# Patient Record
Sex: Female | Born: 1965 | Race: White | Hispanic: No | Marital: Married | Smoking: Never smoker
Health system: Southern US, Community
[De-identification: ages and names within clinical notes are randomized; demographics above are authoritative.]

## PROBLEM LIST (undated history)

## (undated) DIAGNOSIS — Q453 Other congenital malformations of pancreas and pancreatic duct: Secondary | ICD-10-CM

## (undated) DIAGNOSIS — K224 Dyskinesia of esophagus: Secondary | ICD-10-CM

## (undated) DIAGNOSIS — K219 Gastro-esophageal reflux disease without esophagitis: Secondary | ICD-10-CM

## (undated) HISTORY — PX: ROTATOR CUFF REPAIR: SHX139

## (undated) HISTORY — PX: OOPHORECTOMY: SHX86

## (undated) HISTORY — PX: APPENDECTOMY: SHX54

## (undated) HISTORY — DX: Other congenital malformations of pancreas and pancreatic duct: Q45.3

---

## 1998-10-30 ENCOUNTER — Other Ambulatory Visit: Admission: RE | Admit: 1998-10-30 | Discharge: 1998-10-30 | Payer: Self-pay | Admitting: *Deleted

## 1999-10-30 ENCOUNTER — Other Ambulatory Visit: Admission: RE | Admit: 1999-10-30 | Discharge: 1999-10-30 | Payer: Self-pay | Admitting: *Deleted

## 2000-11-15 ENCOUNTER — Other Ambulatory Visit: Admission: RE | Admit: 2000-11-15 | Discharge: 2000-11-15 | Payer: Self-pay | Admitting: *Deleted

## 2001-11-30 ENCOUNTER — Other Ambulatory Visit: Admission: RE | Admit: 2001-11-30 | Discharge: 2001-11-30 | Payer: Self-pay | Admitting: *Deleted

## 2002-12-12 ENCOUNTER — Other Ambulatory Visit: Admission: RE | Admit: 2002-12-12 | Discharge: 2002-12-12 | Payer: Self-pay | Admitting: Obstetrics and Gynecology

## 2003-01-02 ENCOUNTER — Ambulatory Visit (HOSPITAL_COMMUNITY): Admission: RE | Admit: 2003-01-02 | Discharge: 2003-01-02 | Payer: Self-pay | Admitting: Obstetrics & Gynecology

## 2003-01-02 ENCOUNTER — Encounter: Payer: Self-pay | Admitting: Obstetrics & Gynecology

## 2004-01-17 ENCOUNTER — Other Ambulatory Visit: Admission: RE | Admit: 2004-01-17 | Discharge: 2004-01-17 | Payer: Self-pay | Admitting: Obstetrics and Gynecology

## 2005-06-11 ENCOUNTER — Ambulatory Visit (HOSPITAL_COMMUNITY): Admission: RE | Admit: 2005-06-11 | Discharge: 2005-06-11 | Payer: Self-pay | Admitting: Obstetrics and Gynecology

## 2005-06-11 ENCOUNTER — Encounter (INDEPENDENT_AMBULATORY_CARE_PROVIDER_SITE_OTHER): Payer: Self-pay | Admitting: Specialist

## 2005-07-10 ENCOUNTER — Encounter: Admission: RE | Admit: 2005-07-10 | Discharge: 2005-07-10 | Payer: Self-pay | Admitting: Family Medicine

## 2006-01-05 ENCOUNTER — Ambulatory Visit (HOSPITAL_COMMUNITY): Admission: RE | Admit: 2006-01-05 | Discharge: 2006-01-05 | Payer: Self-pay | Admitting: Orthopedic Surgery

## 2006-04-01 ENCOUNTER — Ambulatory Visit (HOSPITAL_BASED_OUTPATIENT_CLINIC_OR_DEPARTMENT_OTHER): Admission: RE | Admit: 2006-04-01 | Discharge: 2006-04-01 | Payer: Self-pay | Admitting: Orthopedic Surgery

## 2006-10-16 ENCOUNTER — Emergency Department (HOSPITAL_COMMUNITY): Admission: EM | Admit: 2006-10-16 | Discharge: 2006-10-16 | Payer: Self-pay | Admitting: Emergency Medicine

## 2006-12-13 ENCOUNTER — Ambulatory Visit: Payer: Self-pay | Admitting: Internal Medicine

## 2006-12-22 ENCOUNTER — Ambulatory Visit: Payer: Self-pay

## 2006-12-22 ENCOUNTER — Encounter: Payer: Self-pay | Admitting: Cardiology

## 2006-12-22 ENCOUNTER — Encounter: Admission: RE | Admit: 2006-12-22 | Discharge: 2006-12-22 | Payer: Self-pay | Admitting: Obstetrics and Gynecology

## 2007-02-14 ENCOUNTER — Ambulatory Visit: Payer: Self-pay | Admitting: Internal Medicine

## 2007-03-17 ENCOUNTER — Encounter: Admission: RE | Admit: 2007-03-17 | Discharge: 2007-03-17 | Payer: Self-pay | Admitting: Gastroenterology

## 2007-09-05 ENCOUNTER — Encounter: Admission: RE | Admit: 2007-09-05 | Discharge: 2007-09-05 | Payer: Self-pay | Admitting: Family Medicine

## 2007-09-09 ENCOUNTER — Ambulatory Visit: Payer: Self-pay | Admitting: Internal Medicine

## 2007-09-20 ENCOUNTER — Encounter: Admission: RE | Admit: 2007-09-20 | Discharge: 2007-09-20 | Payer: Self-pay | Admitting: Family Medicine

## 2007-09-29 ENCOUNTER — Encounter: Admission: RE | Admit: 2007-09-29 | Discharge: 2007-09-29 | Payer: Self-pay | Admitting: Gastroenterology

## 2007-10-14 ENCOUNTER — Encounter: Admission: RE | Admit: 2007-10-14 | Discharge: 2007-10-14 | Payer: Self-pay | Admitting: Gastroenterology

## 2007-12-27 ENCOUNTER — Encounter: Admission: RE | Admit: 2007-12-27 | Discharge: 2007-12-27 | Payer: Self-pay | Admitting: Internal Medicine

## 2008-03-12 ENCOUNTER — Ambulatory Visit: Payer: Self-pay | Admitting: Pulmonary Disease

## 2008-03-12 DIAGNOSIS — R0602 Shortness of breath: Secondary | ICD-10-CM | POA: Insufficient documentation

## 2008-03-12 DIAGNOSIS — Q453 Other congenital malformations of pancreas and pancreatic duct: Secondary | ICD-10-CM | POA: Insufficient documentation

## 2008-03-13 ENCOUNTER — Ambulatory Visit: Payer: Self-pay

## 2008-03-13 ENCOUNTER — Encounter: Payer: Self-pay | Admitting: Pulmonary Disease

## 2008-03-22 ENCOUNTER — Ambulatory Visit: Payer: Self-pay | Admitting: Pulmonary Disease

## 2008-03-23 ENCOUNTER — Telehealth (INDEPENDENT_AMBULATORY_CARE_PROVIDER_SITE_OTHER): Payer: Self-pay | Admitting: *Deleted

## 2008-03-30 ENCOUNTER — Ambulatory Visit: Payer: Self-pay | Admitting: Pulmonary Disease

## 2009-05-30 ENCOUNTER — Encounter: Payer: Self-pay | Admitting: Internal Medicine

## 2009-06-06 ENCOUNTER — Ambulatory Visit: Payer: Self-pay | Admitting: Internal Medicine

## 2009-06-06 DIAGNOSIS — R079 Chest pain, unspecified: Secondary | ICD-10-CM | POA: Insufficient documentation

## 2009-06-06 DIAGNOSIS — R0789 Other chest pain: Secondary | ICD-10-CM | POA: Insufficient documentation

## 2009-06-20 ENCOUNTER — Encounter: Payer: Self-pay | Admitting: Internal Medicine

## 2009-06-20 ENCOUNTER — Ambulatory Visit: Payer: Self-pay

## 2010-02-07 ENCOUNTER — Encounter: Admission: RE | Admit: 2010-02-07 | Discharge: 2010-02-07 | Payer: Self-pay | Admitting: Otolaryngology

## 2010-04-24 ENCOUNTER — Ambulatory Visit: Payer: Self-pay | Admitting: Oncology

## 2010-06-05 ENCOUNTER — Ambulatory Visit: Payer: Self-pay | Admitting: Oncology

## 2010-07-07 ENCOUNTER — Ambulatory Visit: Payer: Self-pay | Admitting: Oncology

## 2010-10-22 LAB — HM DEXA SCAN: HM Dexa Scan: NORMAL

## 2010-10-22 LAB — HM MAMMOGRAPHY

## 2010-11-02 ENCOUNTER — Encounter: Payer: Self-pay | Admitting: Internal Medicine

## 2010-11-02 ENCOUNTER — Encounter: Payer: Self-pay | Admitting: Family Medicine

## 2010-11-13 NOTE — Assessment & Plan Note (Signed)
Summary: consult for dyspnea   Referred by:  [name] PCP:  [name]  Chief Complaint:  Pulmonary Consult.  History of Present Illness: the patient is a 45 year old female who I have been asked to see for dyspnea.  The patient states that she was in her usual state of health until approximately 6 months ago, when she began to have difficulty taking a deep breath.  The patient feels her breathing has been getting worse since that time.  She describes an atypical pain/discomfort that is in her epigastrium and lower left chest.  This has been an ongoing issue since December, and an extensive workup has been unrevealing.  The patient had a recent CT chest in March which showed no evidence for pulmonary embolus, no parenchymal or airway issues, and a right cystlike structure on her pulmonary vein that had decreased in size from the last scan.  The patient states that she does not get winded, but feels that she is unable to take a deep breath.  She can walk on a treadmill for at least 20 minutes without shortness of breath, and can do her light housework without difficulties.  Her discomfort and dyspnea is worse at night, and it does not matter about body position.  She has had no significant cough or mucus production.  She has had no recent echo, and has never had pulmonary function studies.  She does have a history of reflux disease for which she is on Nexium.  Of note, the patient's weight is up 10 pounds over the last two years.     Current Allergies: ! 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     pt is  married.    pt does not have any children.   Risk Factors:  Tobacco use:  never   Review of Systems      See HPI   Vital Signs:  Patient Profile:   45 Years Old Female Height:     62 inches Weight:      133.25 pounds O2 Sat:      100 % O2 treatment:    Room Air Temp:     98.8 degrees F oral Pulse rate:   80 / minute BP sitting:   110 / 70  (left arm) Cuff size:   regular  Vitals Entered By: Cyndia Diver LPN (March 12, 453 2:12 PM)             Is Patient Diabetic? No Comments Medications reviewed with patient Cyndia Diver LPN  March 12, 980 2:12 PM      Physical Exam  General:     [name] female in no acute distress Eyes:     PERRLA and EOMI.   Nose:     patent without discharge Mouth:     clear Neck:     mid JVD, thyromegaly, or lymphadenopathy. Lungs:     totally clear to auscultation Heart:     regular rate and rhythm, no MRG Abdomen:     soft and  nontender, bowel sounds present Extremities:     no significant edema, pulses intact distally Neurologic:     alert and oriented, moves all 4 extremities.     Impression & Recommendations:  Problem # 1:  DYSPNEA (ICD-786.05) the patient is describing shortness of breath is related to an inability to take a deep breath.  She believes this is in turn related to her epigastric/lower chest discomfort.the patient can exercise and do moderate activity without any issues, but then can have symptoms of shortness of breath at rest whether lying or sitting.  At this point, I would like to do full pulmonary function studies to look at lung volumes and also airflows, as well as an echocardiogram to rule out pulmonary hypertension and possibly mitral valve prolapse.  The patient is agreeable to this approach.  Medications Added to Medication List This Visit: 1)  Nexium 40 Mg Cpdr (Esomeprazole magnesium) .... Take 1 tablet by mouth once a day   Patient Instructions: 1)  will schedule for breathing tests and  ultrasound of heart. 2)  will call once test results available.   ]

## 2010-11-13 NOTE — Letter (Signed)
Summary: [facility]   Imported By: Kassie Mends 07/23/2009 09:41:43  _____________________________________________________________________  External Attachment:    Type:   Image     Comment:   External Document

## 2010-11-13 NOTE — Miscellaneous (Signed)
Summary: Orders Update pft charges  Clinical Lists Changes  Orders: Added new Service order of Carbon Monoxide diffusing w/capacity (94720) - Signed Added new Service order of Lung Volumes (94240) - Signed Added new Service order of Spirometry (Pre & Post) (94060) - Signed 

## 2010-11-13 NOTE — Assessment & Plan Note (Signed)
Summary: [name]   Referring Provider:  Sherin Quarry Primary Provider:  Lupe Carney  CC:  [phone], pt reports chest pains, and intermittently and they may come on any time of day. She has been feeling very fatigued and tires easily.  [name]  History of Present Illness: [name] is a 45 year old woman, who I have seen in the past. Her chest pain. She comes in today for a return evaluation.  I last saw [name] back in November of 2008. Did not plan further testing. She had had an echocardiogram that was normal. After this, the patient was seen by Mellody Dance clamps. PFTs were done, which were normal.  The patient is now being evaluated at [facility] in the GI department by Dr. Lanell Matar. She she tells me she has a stomach emptying study and an esophagus test, which were both normal. She is scheduled for endoscopy on September 17.  She continues to have episodes of chest pain. She describes them at their worst as a stabbing sensation in the left parasternal area. They radiate to her arm. She does have some back pain.  Dr. Lanell Matar has given her [place] prescription for Baclofen to try.  The spells occur any time. Note no particular association though more often with walking. She has cut back on her activities for concern of these symptoms.she does not have anything that will alleviate other than time.  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 Take 1 Tablet By Mouth Once A Day  Current Medications (verified): 1)  Nexium 40 Mg  Cpdr (Esomeprazole Magnesium) .... Take 1 Tablet By Mouth Once A Day 2)  Baclofen 10 Mg Tabs (Baclofen) .... Two Times A Day 3)  Aspirin 81 Mg Tabs (Aspirin) .... At Bedtime  Allergies (verified): 1)  ! Sulfa  Past History:  Past Surgical  History: Last updated: 03/12/2008 status post appendectomy and right oophorectomy status post rotator cuff repair  Family History: Last updated: 03/12/2008 emphysema: paternal grandfather allergies: mother asthma: mother heart disease: maternal grandmother cancer: maternal grandfather (lymphoma), maternal aunt (breast)   Social History: Last updated: 03/12/2008 Patient never smoked.  pt is married. pt does not have any children.  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 Negative to the above problem except as noted  Vital Signs:  Patient profile:   45 year old female Height:      62 inches Weight:      129 pounds BMI:     23.68 Pulse rate:   79 / minute Pulse rhythm:   regular BP sitting:   124 / 72  (left arm) Cuff size:   regular  Vitals Entered By: Judithe Modest CMA (June 06, 2009 9:23 AM)  Physical Exam  Additional Exam:  HEENT:  Normocephalic, atraumatic. EOMI, PERRLA.  Neck: JVP is normal. No thyromegaly. No bruits.  Lungs: clear to auscultation. No rales no wheezes.  Heart: Regular rate and rhythm. Normal S1, S2. No S3.   No significant murmurs. PMI not displaced.  Abdomen:  Supple, nontender. Normal bowel sounds. No masses. No hepatomegaly.  Extremities:   Good distal pulses throughout. No lower extremity edema.  Musculoskeletal :moving all extremities.  Neuro:   alert  and oriented x3.    EKG  Procedure date:  06/06/2009  Findings:      NSR. 79 bpm  Impression & Recommendations:  Problem # 1:  CHEST PAIN-UNSPECIFIED (ICD-786.50) patient has had long history of chest pain. Note there has not been a progression. There are some atypical features. Note CT and scan in the past did not show any evidence of calcification of the coronary arteries. Indeed, she may have coronary spasm.  What I would recommend is scheduling a stress echo if this is negative, empiric  trial of some calcium  channel blocker for spasm.  Note in review of her history. Her lipids are excellent. Her updated medication list for this problem includes:    Aspirin 81 Mg Tabs (Aspirin) .[name]... At bedtime  Other Orders: EKG w/ Interpretation (93000) EKG w/ Interpretation (93000) Stress Echo (Stress Echo)  Patient Instructions: 1)  Your physician has requested that you have a stress echocardiogram. For further information please visit [email or web address]  Please follow instruction sheet as given. Will call you  with results of test.

## 2010-12-26 ENCOUNTER — Encounter: Payer: Self-pay | Admitting: Family Medicine

## 2011-02-14 ENCOUNTER — Encounter: Payer: Self-pay | Admitting: Family Medicine

## 2011-02-20 ENCOUNTER — Encounter: Payer: Self-pay | Admitting: Family Medicine

## 2011-02-20 ENCOUNTER — Ambulatory Visit (INDEPENDENT_AMBULATORY_CARE_PROVIDER_SITE_OTHER): Payer: Managed Care, Other (non HMO) | Admitting: Family Medicine

## 2011-02-20 DIAGNOSIS — M6281 Muscle weakness (generalized): Secondary | ICD-10-CM

## 2011-02-20 DIAGNOSIS — K219 Gastro-esophageal reflux disease without esophagitis: Secondary | ICD-10-CM

## 2011-02-20 DIAGNOSIS — Z13220 Encounter for screening for lipoid disorders: Secondary | ICD-10-CM

## 2011-02-20 DIAGNOSIS — J309 Allergic rhinitis, unspecified: Secondary | ICD-10-CM | POA: Insufficient documentation

## 2011-02-20 DIAGNOSIS — R5383 Other fatigue: Secondary | ICD-10-CM | POA: Insufficient documentation

## 2011-02-20 DIAGNOSIS — R5381 Other malaise: Secondary | ICD-10-CM

## 2011-02-20 DIAGNOSIS — G43909 Migraine, unspecified, not intractable, without status migrainosus: Secondary | ICD-10-CM

## 2011-02-20 DIAGNOSIS — R011 Cardiac murmur, unspecified: Secondary | ICD-10-CM

## 2011-02-20 DIAGNOSIS — IMO0002 Reserved for concepts with insufficient information to code with codable children: Secondary | ICD-10-CM

## 2011-02-20 DIAGNOSIS — R29898 Other symptoms and signs involving the musculoskeletal system: Secondary | ICD-10-CM | POA: Insufficient documentation

## 2011-02-20 LAB — COMPREHENSIVE METABOLIC PANEL
ALT: 17 U/L (ref 0–35)
AST: 18 U/L (ref 0–37)
Albumin: 4.5 g/dL (ref 3.5–5.2)
Alkaline Phosphatase: 56 U/L (ref 39–117)
BUN: 15 mg/dL (ref 6–23)
CO2: 31 mEq/L (ref 19–32)
Calcium: 9.6 mg/dL (ref 8.4–10.5)
Chloride: 103 mEq/L (ref 96–112)
Creatinine, Ser: 0.6 mg/dL (ref 0.4–1.2)
GFR: 124.02 mL/min (ref >60.00)
Glucose, Bld: 90 mg/dL (ref 70–99)
Potassium: 4.5 mEq/L (ref 3.5–5.1)
Sodium: 142 mEq/L (ref 135–145)
Total Bilirubin: 0.6 mg/dL (ref 0.3–1.2)
Total Protein: 7.1 g/dL (ref 6.0–8.3)

## 2011-02-20 LAB — CBC WITH DIFFERENTIAL/PLATELET
Basophils Absolute: 0 10*3/uL (ref 0.0–0.1)
Basophils Relative: 0.6 % (ref 0.0–3.0)
Eosinophils Absolute: 0 10*3/uL (ref 0.0–0.7)
Eosinophils Relative: 0.9 % (ref 0.0–5.0)
HCT: 34.6 % — ABNORMAL LOW (ref 36.0–46.0)
Hemoglobin: 11.8 g/dL — ABNORMAL LOW (ref 12.0–15.0)
Lymphocytes Relative: 38.9 % (ref 12.0–46.0)
Lymphs Abs: 1.8 10*3/uL (ref 0.7–4.0)
MCHC: 34 g/dL (ref 30.0–36.0)
MCV: 85.7 fl (ref 78.0–100.0)
Monocytes Absolute: 0.4 10*3/uL (ref 0.1–1.0)
Monocytes Relative: 9 % (ref 3.0–12.0)
Neutro Abs: 2.4 10*3/uL (ref 1.4–7.7)
Neutrophils Relative %: 50.6 % (ref 43.0–77.0)
Platelets: 315 10*3/uL (ref 150.0–400.0)
RBC: 4.04 Mil/uL (ref 3.87–5.11)
RDW: 13.1 % (ref 11.5–14.6)
WBC: 4.7 10*3/uL (ref 4.5–10.5)

## 2011-02-20 LAB — LIPID PANEL
Cholesterol: 206 mg/dL — ABNORMAL HIGH (ref 0–200)
HDL: 83.5 mg/dL (ref >39.00)
Total CHOL/HDL Ratio: 2
Triglycerides: 33 mg/dL (ref 0.0–149.0)
VLDL: 6.6 mg/dL (ref 0.0–40.0)

## 2011-02-20 LAB — VITAMIN B12: Vitamin B-12: 206 pg/mL — ABNORMAL LOW (ref 211–911)

## 2011-02-20 LAB — TSH: TSH: 0.36 u[IU]/mL (ref 0.35–5.50)

## 2011-02-20 LAB — LDL CHOLESTEROL, DIRECT: Direct LDL: 109.4 mg/dL

## 2011-02-20 NOTE — Patient Instructions (Signed)
We will call with lab results   

## 2011-02-20 NOTE — Assessment & Plan Note (Signed)
Seeing Dr. Darrelyn Hillock. X-rays and MRI in 10/2010 Currently improved on indomethacin. Has follow up in next week.

## 2011-02-20 NOTE — Assessment & Plan Note (Signed)
Well controlled on current med. She was counsled to be careful with indomethacin. Stop if stomach irritation.

## 2011-02-20 NOTE — Progress Notes (Signed)
Subjective:    Patient ID: [name], female    DOB: Feb 23, 1966, 45 y.o.   MRN: [id number]  HPI  45 year old female here to establish. Has previously seen Dr. Clovis Riley at [facility].  Sees GYN for yearly CPX: Dr. Algie Coffer at [facility].  Laast CPX 03/2010   Has history of esophageal spasms,  GERD and pancreatic divisum: Controlled with baclofen and nexium. Last GI MD was Dr. Merleen Milliner at [facility]. Has also seen MD at [facility] for this issue, but remotely. Last EGD  approximately 2007.  When she was having chest pain due to esophageal spasm.. She did she cardiology. Dr. Tenny Craw with [facility]. Had low risk treadmill stress test and nml EKG,  ECHO of heart..nml per pt.   Review of Systems  Constitutional: Positive for fatigue. Negative for fever and unexpected weight change.       Hot flashes Mild fatigue, not sure if due to recent back issues and trouble sleeping.   HENT: Negative for ear pain, congestion, sore throat, sneezing, trouble swallowing and sinus pressure.   Eyes: Negative for pain and itching.  Respiratory: Negative for cough, shortness of breath and wheezing.   Cardiovascular: Negative for chest pain, palpitations and leg swelling.  Gastrointestinal: Positive for diarrhea. Negative for nausea, abdominal pain, constipation and blood in stool.       Since on indocin  Genitourinary: Negative for dysuria, hematuria, vaginal discharge, difficulty urinating and menstrual problem.  Musculoskeletal: Positive for arthralgias. Negative for myalgias.       Occ mild ache in B ankles/wrists  Skin: Negative for rash.  Neurological: Negative for syncope, weakness, light-headedness, numbness and headaches.       Dr. Darrelyn Hillock noted some right foot flexion weakness but this is not in area that is associated with her herniated disc.  Psychiatric/Behavioral: Negative for confusion and dysphoric mood. The patient is not nervous/anxious.        Objective:   Physical Exam    Constitutional: Vital signs are normal. She appears well-developed and well-nourished. She is cooperative.  Non-toxic appearance. She does not appear ill. No distress.  HENT:  Head: Normocephalic.  Right Ear: Hearing, tympanic membrane, external ear and ear canal normal.  Left Ear: Hearing, tympanic membrane, external ear and ear canal normal.  Nose: Nose normal.  Eyes: Conjunctivae, EOM and lids are normal. Pupils are equal, round, and reactive to light. No foreign bodies found.  Neck: Trachea normal and normal range of motion. Neck supple. Carotid bruit is not present. No mass and no thyromegaly present.  Cardiovascular: Normal rate, regular rhythm, S1 normal, S2 normal and intact distal pulses.  Exam reveals no gallop.   Murmur heard.  Systolic murmur is present with a grade of 2/6  Pulmonary/Chest: Effort normal and breath sounds normal. No respiratory distress. She has no wheezes. She has no rhonchi. She has no rales.  Abdominal: Soft. Normal appearance and bowel sounds are normal. She exhibits no distension, no fluid wave, no abdominal bruit and no mass. There is no hepatosplenomegaly. There is no tenderness. There is no rebound, no guarding and no CVA tenderness. No hernia.  Genitourinary: Uterus normal.  Lymphadenopathy:    She has no cervical adenopathy.    She has no axillary adenopathy.  Neurological: She is alert. She has normal reflexes. She displays no atrophy and no tremor. No cranial nerve deficit or sensory deficit. She exhibits normal muscle tone. She displays no seizure activity.       Strength normal except weakness  4/5 in left foot flexion  Skin: Skin is warm, dry and intact. No rash noted.  Psychiatric: Her speech is normal and behavior is normal. Judgment normal. Her mood appears not anxious. Cognition and memory are normal. She does not exhibit a depressed mood.          Assessment & Plan:

## 2011-02-20 NOTE — Assessment & Plan Note (Signed)
Will eval with labs. MRI spine showed no cause. If continues consider further eval.

## 2011-02-20 NOTE — Assessment & Plan Note (Signed)
Eval with labs. May be due  To back pain causing sleep issues at times.

## 2011-02-21 LAB — VITAMIN D 25 HYDROXY (VIT D DEFICIENCY, FRACTURES): Vit D, 25-Hydroxy: 46 ng/mL (ref 30–89)

## 2011-02-24 NOTE — Assessment & Plan Note (Signed)
[facility]                            CARDIOLOGY OFFICE NOTE   [name], [name]                  MRN:          [id number]  DATE:09/09/2007                            DOB:          04/28/1966    IDENTIFICATION:  The patient is a 45 year old who I have followed in the  past.  I last saw her back in May of this year.  She actually was seen  for chest pain, which at the time I thought was GI, and also  palpitations, which, when I saw her last, she was not having any.   The patient comes back in today for reevaluation.  She has been followed  by Corky Mull at [facility], and also has been seen by Dr. Sherin Quarry in GI,  has had an EGD and abdominal ultrasound which have reportedly been  negative.   In talking to the patient today, she continues to have episodes of chest  pain.  They are atypical in that she can have them while sitting.  They  are not associated with any particular activity.  In fact, she can walk  the dog and not have any chest pressure.  The pressure is left sided,  and she notes some numbness in her fingers.   She has had over the past week and a half or so, feeling like she cannot  get a deep breath.   Here in the office she says also she has been having some more belching  than usual.  She says occasionally she will have a choking sensation on  her food, though she has not choked.   She still is able to walk the dog and do housework.  Notes no change, no  chest pressure with this.   CURRENT MEDICATIONS:  1. Calcium.  2. Protonix 40 daily.  3. She had been on Pepcid b.i.d. as well.  She felt worse.   PHYSICAL EXAMINATION:  The patient currently noting that she has some  chest pressure, left sided, sitting here.  Blood pressure is 113/68, pulse is 82 and regular, weight is 127.  Her lungs are clear.  CARDIAC EXAM:  Regular rate and rhythm, S1, S2 no S3.  Grade 1 to 2/6  systolic murmur at the apex that decreases to near  zero with standing.  ABDOMEN:  Mild epigastric tenderness.  EXTREMITIES:  No edema.   A 12-lead EKG (with pain), normal sinus rhythm, 82 beats per minute.   IMPRESSION:  [name] is a 45 year old with no real risk factors other  than a grandmother having heart issues in her 41s.  She is not a smoker,  she is active, cholesterol is good.  She continues to have chest pain.  Her pain is atypical in the way it presents.  She does have some GI  symptoms, and also some tenderness in her epigastric region.  She is  noting some more belching.  Food may be moving a little slower.   I am not convinced the pain is cardiac, and I am a little reluctant to  proceed with further testing in this area.  I would like  to discuss  things with Corky Mull, her primary, and decide where to proceed.  At  the most, a stress echocardiogram, but, again, risks with this would be  false positives, I think, in this patient.   I encouraged her to do activities as tolerated, and I will be in touch  with her once I have spoken with Corky Mull.     Pricilla Riffle, MD, [facility]  Electronically Signed    PVR/MedQ  DD: 09/09/2007  DT: 09/09/2007  Job #: [id number]   cc:   Corky Mull

## 2011-02-27 NOTE — H&P (Signed)
NAME:  [name], [name]                ACCOUNT NO.:  [id number]   MEDICAL RECORD NO.:  [id number]          PATIENT TYPE:  AMB   LOCATION:  [facility]                           FACILITY:  [facility]   PHYSICIAN:  Richardean Sale, M.D.   DATE OF BIRTH:  Dec 15, 1965   DATE OF ADMISSION:  06/11/2005  DATE OF DISCHARGE:                                HISTORY & PHYSICAL   PREOPERATIVE DIAGNOSIS:  This is a 45 year old gravida 0 white female who  was found to have a simple-appearing ovarian cyst on ultrasound in June of  2006 that was asymptomatic.  Measurements at that time were 5.6 cm in  maximum diameter.  The patient returned for a follow up ultrasound 8 weeks  later, and the cyst was found to have enlarged to 6.4 cm in maximum  diameter, and a newer, smaller cyst measuring 3 cm in maximum diameter was  found adjacent to it.  Given the enlargement of the ovarian cyst, the  patient presents today for laparoscopy with ovarian cystectomy and possible  salpingo-oophorectomy.   PAST MEDICAL HISTORY:  History of elevated prolactin level with normal MRI  and resolution of prolactin level.  Last prolactin drawn on March 24, 2005  was within normal limits.   PAST SURGICAL HISTORY:  1.  Appendectomy.  2.  Left breast biopsy for benign disease.   OBSTETRIC HISTORY:  Gravida 0.   GYNECOLOGIC HISTORY:  No history of abnormal Pap smears or sexually  transmitted infections.  Currently taking Micronor for contraception.   SOCIAL HISTORY:  She is married.  Denies tobacco, alcohol, or drugs.   FAMILY HISTORY:  Maternal aunt with postmenopausal breast cancer.  A sister  with thyroid disease.   MEDICATIONS:  1.  Micronor.  2.  Multivitamin.   ALLERGIES:  SULFA.   PHYSICAL EXAMINATION:  VITAL SIGNS:  Blood pressure is 102/64, temperature  98.7, weight 129 pounds.  GENERAL:  She is a well-developed, well-nourished white female who is in no  acute distress.  HEART:  Regular rate and rhythm.  LUNGS:  Clear to  auscultation bilaterally.  NECK:  Neck and thyroid are within normal limits without masses.  ABDOMEN:  Soft, nontender, nondistended, with no palpable masses.  Liver and  spleen are normal.  There is no hernia.  EXTREMITIES:  Within normal limits, and show no clubbing, cyanosis, or  edema.  NEUROLOGIC:  Within normal limits.  PELVIC:  External genitalia, vagina, and cervix all appear normal.  There is  no cervical motion tenderness.  The uterus is small, anteverted, in the  midline.  There is fullness in the right adnexa.  No tenderness elicited.  Left adnexa normal.   Ultrasound - uterus measures 7 x 3.8 x 3.1 cm.  It is anteverted.  Endometrial thickness of 3.5 mm.  The left ovary is normal.  On the right  ovary, there are 2 simple-appearing cysts, the largest of which is 6.4 cm.  The second is 3 cm.   ASSESSMENT:  A 45 year old gravida 0 white female with an enlarging right  ovarian cyst.   PLAN:  1.  Will proceed with diagnostic laparoscopy, possible ovarian cystectomy,      and possible salpingo-oophorectomy.  Reviewed with patient that risk of      malignancy is low, given the appearance of this cyst on ultrasound, but      that further surgery may be needed in the future.  I reviewed with risks      of the procedure which include, but are not limited to, hemorrhage,      infection, injury to the bowel, the bladder, the ureters, or other      abdominal organs which could require additional surgery, either at the      time of this procedure or in the future, and the possibility of      converting to open laparotomy.  Reviewed the risk of deep vein      thrombosis and pulmonary embolus.  Also reviewed the risk of anesthesia.      The patient voices understanding of all these risks and desires to      proceed.  Informed consent has been obtained.           ______________________________  Richardean Sale, M.D.     JW/MEDQ  D:  06/10/2005  T:  06/10/2005  Job:  [id number]

## 2011-02-27 NOTE — Op Note (Signed)
NAME:  [name], [name]                ACCOUNT NO.:  [id number]   MEDICAL RECORD NO.:  [id number]          PATIENT TYPE:  AMB   LOCATION:  DSC                          FACILITY:  [facility]   PHYSICIAN:  Rodney A. Mortenson, M.D.DATE OF BIRTH:  [birth year]   DATE OF PROCEDURE:  04/01/2006  DATE OF DISCHARGE:                                 OPERATIVE REPORT   JUSTIFICATION:  This 45 year old female has been having trouble with her  left shoulder for a total of 4 months.  She has had two subacromial  injections and neither are giving her much relief.  She continues to have  pain and discomfort and MRI did show a bursal side tear of the supraspinatus  tendon with impingement about the shoulder.  She is now admitted for  surgical correction.  Questions answered and encouraged preoperatively.  Complications were discussed.   PREOPERATIVE DIAGNOSIS:  Bursa side tear rotator cuff left shoulder with  secondary impingement syndrome left shoulder.   POSTOPERATIVE DIAGNOSIS:  Bursa side tear rotator cuff left shoulder with  secondary impingement syndrome left shoulder.   PROCEDURE:  Diagnostic arthroscopy left shoulder; open acromioplasty;  excision of bursal side partial tear rotator cuff and repair left shoulder.   SURGEON:  Lenard Galloway. Chaney Malling, M.D.   ANESTHESIA:  General.   DESCRIPTION OF PROCEDURE:  The patient was placed on the operating table in  supine position.  After satisfactory general anesthesia, the patient was  placed in semisitting position.  The left upper extremity and shoulder was  prepped with DuraPrep and draped in the usual fashion.  Through a posterior  standard portal, arthroscope was introduced and operative portal was placed  anteriorly.  A very careful examination of the shoulder was obtained.  Articular cartilage over the humeral head and glenoid was absent and normal.  The entire circumference of the labrum was normal and the labral attachment  to the biceps was  normal.  The undersurface of the supraspinatus was  examined very carefully and there was only a small area of some fraying.  This was minimal.  The arthroscope was then placed in the subacromial space  and there was an area underneath the acromion where there was marked fraying  and tearing of the supraspinatus and this was the area that was seen on the  MRI.  The arthroscope was then removed.   A Sabercut incision made over the anterolateral aspect of the left shoulder.  Skin edges were retracted.  Bleeders were coagulated.  Deltoid fibers were  released off the anterior aspect of the acromion only.  Subacromial space  could then be entered.  The shoulder was fairly tight.  The shoulder went  through a full range of motion very easily and the humerus was distracted.  The subacromial space was then entered.  Using a small saw, a Neer anterior  acromioplasty was done was the standard manner.  This gave excellent access  to the shoulder strength.  The area of fraying of the supraspinatus could  clearly be seen.  This area was excised in an elliptical manner and a single  Tycron suture used to close this area.  This part of the tendon was  degenerative.  The shoulder was irrigated throughout.  No other pathology  was noted.  Excellent range of motion about the shoulder.  Deltoid fibers  were reattached with 0 Vicryl and 2-0 Vicryl was used to close the  subcutaneous tissue and stainless steel staples were used to close the  skin.  Sterile dressings were applied and the patient returned to the  recovery room in excellent condition.  Technically this went extremely well.   FOLLOWUP:  To my office on Wednesday.  Percocet for pain.           ______________________________  Lenard Galloway. Chaney Malling, M.D.     RAM/MEDQ  D:  04/01/2006  T:  04/01/2006  Job:  [id number]

## 2011-03-04 ENCOUNTER — Encounter: Payer: Self-pay | Admitting: *Deleted

## 2011-04-02 ENCOUNTER — Other Ambulatory Visit: Payer: Self-pay | Admitting: Obstetrics

## 2011-05-25 ENCOUNTER — Other Ambulatory Visit: Payer: Self-pay | Admitting: Family Medicine

## 2011-05-29 ENCOUNTER — Encounter: Payer: Self-pay | Admitting: Family Medicine

## 2011-05-29 ENCOUNTER — Ambulatory Visit (INDEPENDENT_AMBULATORY_CARE_PROVIDER_SITE_OTHER): Payer: Managed Care, Other (non HMO) | Admitting: Family Medicine

## 2011-05-29 DIAGNOSIS — M545 Low back pain, unspecified: Secondary | ICD-10-CM | POA: Insufficient documentation

## 2011-05-29 DIAGNOSIS — N949 Unspecified condition associated with female genital organs and menstrual cycle: Secondary | ICD-10-CM | POA: Insufficient documentation

## 2011-05-29 DIAGNOSIS — IMO0002 Reserved for concepts with insufficient information to code with codable children: Secondary | ICD-10-CM

## 2011-05-29 MED ORDER — GABAPENTIN 100 MG PO CAPS: 100.0000 mg | ORAL_CAPSULE | Freq: Three times a day (TID) | ORAL | Status: DC

## 2011-05-29 NOTE — Progress Notes (Signed)
Subjective:    Patient ID: [name], female    DOB: Mar 15, 1966, 45 y.o.   MRN: [id number]  HPI  45 year old female with history of past herniated disc.. followed in past by Dr. Darrelyn Hillock. Hx of MRI spine in 10/2010 showed ruptured disc. Was previously controlled on indomethacin.. Stopped due to Gi upset. Tylenol does not help. Did not recommend surgery at that time unless progressed to radiate down legs.  Presents today with continued low back pain. Worsened pain in last few weeks. Pain previously only on the left side...now B buttocks are sore, sore in posterior perineum. Groin area pain new in last few weeks. No weakness in legs,  occ ?  Numbness in perineum. Legs occ feel sore and achy.  No fever. No rash.  Occ lower abdominal ache  Has had normal pap at GYN in last few weeks. Felt perineal issues not GYN issue per pt.  Review of Systems  Constitutional: Negative for fever and fatigue.  Respiratory: Negative for shortness of breath.   Gastrointestinal:       Occ intermittant alternating diarrhea and constipation  In past few months  Genitourinary: Positive for pelvic pain. Negative for dysuria, urgency, frequency, flank pain, vaginal bleeding, vaginal discharge, difficulty urinating and vaginal pain.  Skin: Negative for rash.       Objective:   Physical Exam  Constitutional: Vital signs are normal. She appears well-developed and well-nourished. She is cooperative.  Non-toxic appearance. She does not appear ill. No distress.  HENT:  Head: Normocephalic.  Right Ear: Hearing, tympanic membrane, external ear and ear canal normal. Tympanic membrane is not erythematous, not retracted and not bulging.  Left Ear: Hearing, tympanic membrane, external ear and ear canal normal. Tympanic membrane is not erythematous, not retracted and not bulging.  Nose: No mucosal edema or rhinorrhea. Right sinus exhibits no maxillary sinus tenderness and no frontal sinus tenderness. Left  sinus exhibits no maxillary sinus tenderness and no frontal sinus tenderness.  Mouth/Throat: Uvula is midline, oropharynx is clear and moist and mucous membranes are normal.  Eyes: Conjunctivae, EOM and lids are normal. Pupils are equal, round, and reactive to light. No foreign bodies found.  Neck: Trachea normal and normal range of motion. Neck supple. Carotid bruit is not present. No mass and no thyromegaly present.  Cardiovascular: Normal rate, regular rhythm, S1 normal, S2 normal, normal heart sounds, intact distal pulses and normal pulses.  Exam reveals no gallop and no friction rub.   No murmur heard. Pulmonary/Chest: Effort normal and breath sounds normal. Not tachypneic. No respiratory distress. She has no decreased breath sounds. She has no wheezes. She has no rhonchi. She has no rales.  Abdominal: Soft. Normal appearance and bowel sounds are normal. There is no tenderness.  Musculoskeletal:       Lumbar back: She exhibits decreased range of motion and tenderness.       Neg SLR, neg Faber's TTP over anterior pubic bone, ttp in B butocks  Vertebral ttp in sacrum, no vertebral ttp in lumbar spine, but lateral pain in lumbar paraspinous muscles.  Neurological: She is alert. She has normal strength. She displays no atrophy and no tremor. No cranial nerve deficit or sensory deficit. She exhibits normal muscle tone. She displays a negative Romberg sign. She displays no seizure activity. Coordination and gait normal.  Skin: Skin is warm, dry and intact. No rash noted.  Psychiatric: Her speech is normal and behavior is normal. Judgment and thought content normal. Her mood  appears not anxious. Cognition and memory are normal. She does not exhibit a depressed mood.          Assessment & Plan:

## 2011-05-29 NOTE — Assessment & Plan Note (Signed)
Her symptoms have definitely progressed and worsened in last few weeks... She now has symptoms concerning for larger central compression of spine at L5 given S3/4/5 radiculopathy symptoms. No urinary incontinence, but some fecal changes, although no incontinence. I will re-eval with an MRI of lumbosacral spine and likely refer back to Dr. Darrelyn Hillock for possible surgical treatment.  In meantime given irritation of stomach with NSAIDS... We will start neurontin for neuropathic pain.

## 2011-05-29 NOTE — Patient Instructions (Addendum)
Stop at front desk to set up MRI as soon as possible. Call if worsening symptoms, numbness in legs, weakness or side effects of medicaiton.

## 2011-06-04 ENCOUNTER — Other Ambulatory Visit: Payer: Managed Care, Other (non HMO)

## 2011-06-04 ENCOUNTER — Ambulatory Visit
Admission: RE | Admit: 2011-06-04 | Discharge: 2011-06-04 | Disposition: A | Discharge status: Home / Self Care | Payer: Managed Care, Other (non HMO) | Source: Ambulatory Visit | Attending: Family Medicine | Admitting: Family Medicine

## 2011-06-04 DIAGNOSIS — M545 Low back pain: Secondary | ICD-10-CM

## 2011-06-04 DIAGNOSIS — N949 Unspecified condition associated with female genital organs and menstrual cycle: Secondary | ICD-10-CM

## 2011-06-04 DIAGNOSIS — IMO0002 Reserved for concepts with insufficient information to code with codable children: Secondary | ICD-10-CM

## 2011-07-22 ENCOUNTER — Other Ambulatory Visit: Payer: Self-pay | Admitting: Family Medicine

## 2011-07-28 ENCOUNTER — Ambulatory Visit (INDEPENDENT_AMBULATORY_CARE_PROVIDER_SITE_OTHER): Payer: Managed Care, Other (non HMO)

## 2011-07-28 DIAGNOSIS — Z23 Encounter for immunization: Secondary | ICD-10-CM

## 2011-09-23 ENCOUNTER — Other Ambulatory Visit: Payer: Self-pay | Admitting: Family Medicine

## 2011-11-06 ENCOUNTER — Encounter: Payer: Self-pay | Admitting: Family Medicine

## 2011-11-11 ENCOUNTER — Encounter: Payer: Self-pay | Admitting: Family Medicine

## 2011-11-12 ENCOUNTER — Encounter: Payer: Self-pay | Admitting: *Deleted

## 2011-11-17 ENCOUNTER — Other Ambulatory Visit: Payer: Self-pay | Admitting: Family Medicine

## 2011-12-24 ENCOUNTER — Encounter: Payer: Self-pay | Admitting: Family Medicine

## 2011-12-24 ENCOUNTER — Ambulatory Visit (INDEPENDENT_AMBULATORY_CARE_PROVIDER_SITE_OTHER): Payer: Managed Care, Other (non HMO) | Admitting: Family Medicine

## 2011-12-24 VITALS — BP 100/64 | HR 76 | Temp 98.1°F | Wt 132.0 lb

## 2011-12-24 DIAGNOSIS — J069 Acute upper respiratory infection, unspecified: Secondary | ICD-10-CM

## 2011-12-24 MED ORDER — HYDROCOD POLST-CHLORPHEN POLST 10-8 MG/5ML PO LQCR: 5.0000 mL | Freq: Every evening | ORAL | Status: DC | PRN

## 2011-12-24 NOTE — Progress Notes (Signed)
SUBJECTIVE:  [name] is a 46 y.o. female who complains of coryza, congestion, sneezing, sore throat and dry cough for 6 days. She denies a history of anorexia, chest pain, chills, fevers, shortness of breath, sweats and vomiting and denies a history of asthma. Patient denies smoke cigarettes.   Patient Active Problem List  Diagnoses  . PANCREAS DIVISUM  . GERD (gastroesophageal reflux disease)  . Allergic rhinitis  . Heart murmur  . Migraine  . HX of Herniated vertebral disc  . Fatigue  . Perineal pain in female  . Low back pain   Past Medical History  Diagnosis Date  . Chest pain   . Congenital anomalies of pancreas    Past Surgical History  Procedure Date  . Appendectomy   . Oophorectomy     Right  . Rotator cuff repair    History  Substance Use Topics  . Smoking status: Never Smoker   . Smokeless tobacco: Not on file  . Alcohol Use: Not on file   Family History  Problem Relation Age of Onset  . Emphysema Paternal Grandfather   . Allergies Mother   . Asthma Mother   . Hypertension Mother   . Heart disease Maternal Grandmother   . Lymphoma Maternal Grandfather   . Breast cancer Maternal Aunt   . Hyperlipidemia Father   . Heart disease Father 43  . Hypothyroidism Sister 84    post surgical   Allergies  Allergen Reactions  . Sulfonamide Derivatives    Current Outpatient Prescriptions on File Prior to Visit  Medication Sig Dispense Refill  . aspirin 81 MG tablet Take 81 mg by mouth at bedtime.        . baclofen (LIORESAL) 10 MG tablet Take 10 mg by mouth 2 (two) times daily.        [name] NEXIUM 40 MG capsule TAKE 1 CAPSULE EVERY DAY  30 capsule  1  . ESTRACE VAGINAL 0.1 MG/GM vaginal cream       . gabapentin (NEURONTIN) 100 MG capsule Take 1 capsule (100 mg total) by mouth 3 (three) times daily.  90 capsule  0  . indomethacin (INDOCIN) 25 MG capsule Take 25 mg by mouth 2 (two) times daily with a meal.         The PMH, PSH, Social History, Family  History, Medications, and allergies have been reviewed in [facility], and have been updated if relevant.  OBJECTIVE: BP 100/64  Pulse 76  Temp(Src) 98.1 F (36.7 C) (Oral)  Wt 132 lb (59.875 kg)  She appears well, vital signs are as noted. Ears normal.  Throat and pharynx normal.  Neck supple. No adenopathy in the neck. Nose is congested. Sinuses non tender. The chest is clear, without wheezes or rales.  ASSESSMENT:  viral upper respiratory illness  PLAN: Symptomatic therapy suggested: push fluids, rest and return office visit prn if symptoms persist or worsen. Lack of antibiotic effectiveness discussed with her. Call or return to clinic prn if these symptoms worsen or fail to improve as anticipated.

## 2011-12-24 NOTE — Patient Instructions (Signed)
Hang in there, this is probably a virus.  Drink lots of fluids.  Treat sympotmatically with Mucinex, nasal saline irrigation, and Tylenol/Ibuprofen. Also try claritin D or zyrtec D over the counter- two times a day as needed ( have to sign for them at pharmacy). You can use warm compresses.  Cough suppressant at night. Call if not improving as expected in 5-7 days.

## 2012-01-16 ENCOUNTER — Other Ambulatory Visit: Payer: Self-pay | Admitting: Family Medicine

## 2012-03-14 ENCOUNTER — Other Ambulatory Visit: Payer: Self-pay | Admitting: Family Medicine

## 2012-04-27 ENCOUNTER — Other Ambulatory Visit (HOSPITAL_COMMUNITY): Payer: Self-pay | Admitting: Podiatry

## 2012-04-27 DIAGNOSIS — S92909A Unspecified fracture of unspecified foot, initial encounter for closed fracture: Secondary | ICD-10-CM

## 2012-05-04 ENCOUNTER — Encounter (HOSPITAL_COMMUNITY): Payer: Managed Care, Other (non HMO)

## 2012-05-04 ENCOUNTER — Other Ambulatory Visit (HOSPITAL_COMMUNITY): Payer: Managed Care, Other (non HMO)

## 2012-05-05 ENCOUNTER — Encounter (HOSPITAL_COMMUNITY)
Admission: RE | Admit: 2012-05-05 | Discharge: 2012-05-05 | Disposition: A | Discharge status: Home / Self Care | Payer: Managed Care, Other (non HMO) | Source: Ambulatory Visit | Attending: Podiatry | Admitting: Podiatry

## 2012-05-05 DIAGNOSIS — X58XXXA Exposure to other specified factors, initial encounter: Secondary | ICD-10-CM | POA: Insufficient documentation

## 2012-05-05 DIAGNOSIS — S92909A Unspecified fracture of unspecified foot, initial encounter for closed fracture: Secondary | ICD-10-CM | POA: Insufficient documentation

## 2012-05-05 MED ORDER — TECHNETIUM TC 99M MEDRONATE IV KIT
25.0000 | PACK | Freq: Once | INTRAVENOUS | Status: AC | PRN
  Administered 2012-05-05: 25 via INTRAVENOUS

## 2012-05-15 ENCOUNTER — Other Ambulatory Visit: Payer: Self-pay | Admitting: Family Medicine

## 2012-06-03 ENCOUNTER — Telehealth: Payer: Self-pay | Admitting: Family Medicine

## 2012-06-03 ENCOUNTER — Other Ambulatory Visit (INDEPENDENT_AMBULATORY_CARE_PROVIDER_SITE_OTHER): Payer: Managed Care, Other (non HMO)

## 2012-06-03 DIAGNOSIS — Z13220 Encounter for screening for lipoid disorders: Secondary | ICD-10-CM

## 2012-06-03 DIAGNOSIS — N926 Irregular menstruation, unspecified: Secondary | ICD-10-CM

## 2012-06-03 LAB — COMPREHENSIVE METABOLIC PANEL
ALT: 13 U/L (ref 0–35)
AST: 19 U/L (ref 0–37)
Albumin: 4.6 g/dL (ref 3.5–5.2)
Alkaline Phosphatase: 57 U/L (ref 39–117)
BUN: 17 mg/dL (ref 6–23)
CO2: 29 mEq/L (ref 19–32)
Calcium: 9.6 mg/dL (ref 8.4–10.5)
Chloride: 104 mEq/L (ref 96–112)
Creatinine, Ser: 0.5 mg/dL (ref 0.4–1.2)
GFR: 128.62 mL/min (ref >60.00)
Glucose, Bld: 96 mg/dL (ref 70–99)
Potassium: 4.6 mEq/L (ref 3.5–5.1)
Sodium: 141 mEq/L (ref 135–145)
Total Bilirubin: 0.5 mg/dL (ref 0.3–1.2)
Total Protein: 7.2 g/dL (ref 6.0–8.3)

## 2012-06-03 LAB — LIPID PANEL
Cholesterol: 244 mg/dL — ABNORMAL HIGH (ref 0–200)
HDL: 87.5 mg/dL (ref >39.00)
Total CHOL/HDL Ratio: 3
Triglycerides: 71 mg/dL (ref 0.0–149.0)
VLDL: 14.2 mg/dL (ref 0.0–40.0)

## 2012-06-03 LAB — LDL CHOLESTEROL, DIRECT: Direct LDL: 125.8 mg/dL

## 2012-06-03 NOTE — Telephone Encounter (Signed)
I put in order, but i don't know the sreason she is having this test to enter a diagnosis.. Did she tell you?

## 2012-06-03 NOTE — Telephone Encounter (Signed)
Message copied by Excell Seltzer on Fri Jun 03, 2012  8:12 AM ------      Message from: [name]      Created: Tue May 31, 2012 11:05 AM      Regarding: Lab orders for Friday, 8.23.13       Patient is scheduled for CPX labs, please order future labs, Thanks , Camelia Eng

## 2012-06-03 NOTE — Telephone Encounter (Signed)
Her GYN (Dr. Ernestina Penna) requested that we draw it since she was coming here for appt. General screening as far as the patient could advise.

## 2012-06-03 NOTE — Telephone Encounter (Signed)
Message copied by Excell Seltzer on Fri Jun 03, 2012  9:24 AM ------      Message from: Josph Macho A      Created: Fri Jun 03, 2012  8:45 AM       Patient's GYN was asking if you would draw a prolactin level. I drew for it, it just needs to be added to her orders. Thanks!

## 2012-06-06 LAB — PROLACTIN: Prolactin: 12.2 ng/mL

## 2012-06-07 ENCOUNTER — Ambulatory Visit (INDEPENDENT_AMBULATORY_CARE_PROVIDER_SITE_OTHER): Payer: Managed Care, Other (non HMO) | Admitting: *Deleted

## 2012-06-07 ENCOUNTER — Ambulatory Visit (INDEPENDENT_AMBULATORY_CARE_PROVIDER_SITE_OTHER): Payer: Managed Care, Other (non HMO) | Admitting: Family Medicine

## 2012-06-07 ENCOUNTER — Telehealth: Payer: Self-pay | Admitting: Family Medicine

## 2012-06-07 VITALS — Wt 132.0 lb

## 2012-06-07 DIAGNOSIS — Z23 Encounter for immunization: Secondary | ICD-10-CM

## 2012-06-07 NOTE — Telephone Encounter (Signed)
Caller: [name]/Patient; Patient Name: [name]; PCP: Kerby Nora [facility]); Best Callback Phone Number: [phone]. Call regarding Laceration on Left Hand on 8-27, cut over Chicken wire, slightly rusty, laceration 1/2 inch in lenght, not deep.  Last Tetnus per Patient's records was 2007.  Patient washed, applied alcolhol and antibiotic ointment. Afebrile.  All emergent symptoms ruled out per Lacerations Protocol.  Appointment scheduled on 8-27 at 1600 with Dr Ermalene Searing.  Patient verbalized understanding.

## 2012-06-09 NOTE — Progress Notes (Signed)
Cut is very small scratch. Tetanus given. Pt not seen.

## 2012-06-10 ENCOUNTER — Encounter: Payer: Self-pay | Admitting: Family Medicine

## 2012-06-10 ENCOUNTER — Ambulatory Visit (INDEPENDENT_AMBULATORY_CARE_PROVIDER_SITE_OTHER): Payer: Managed Care, Other (non HMO) | Admitting: Family Medicine

## 2012-06-10 VITALS — BP 116/64 | HR 80 | Temp 98.4°F | Resp 20 | Ht 62.0 in | Wt 132.5 lb

## 2012-06-10 DIAGNOSIS — M545 Low back pain: Secondary | ICD-10-CM

## 2012-06-10 DIAGNOSIS — K219 Gastro-esophageal reflux disease without esophagitis: Secondary | ICD-10-CM

## 2012-06-10 NOTE — Progress Notes (Signed)
Subjective:    Patient ID: [name], female    DOB: [birth year], 46 y.o.   MRN: [id number]  HPI 46 year old female here for annual check on chronic health issues.   Last pelvic and breast exam ant GYN this past year. Normal.  GERD, control on nexium. On Bacolfen for esophageal spasms.  Interested i trying to wean off nexium.  Reviewed labs in detail. Lab Results  Component Value Date   CHOL 244* 06/03/2012   HDL 87.50 06/03/2012   LDLDIRECT 125.8 06/03/2012   TRIG 71.0 06/03/2012   CHOLHDL 3 06/03/2012    Wt Readings from Last 3 Encounters:  06/10/12 132 lb 8 oz (60.102 kg)  06/07/12 132 lb (59.875 kg)  12/24/11 132 lb (59.875 kg)   Exercise: None Diet:  Good, but some animal fats.  Chronic low back pain, herniated disc: fairly well controlled. Using NSAIDs prn. Home PT. Lifting dog frequently, 75 lbs.  Review of Systems  Constitutional: Negative for fever, fatigue and unexpected weight change.  HENT: Negative for ear pain, congestion, sore throat, sneezing, trouble swallowing and sinus pressure.   Eyes: Negative for pain and itching.  Respiratory: Negative for cough, shortness of breath and wheezing.   Cardiovascular: Negative for chest pain, palpitations and leg swelling.  Gastrointestinal: Negative for nausea, abdominal pain, diarrhea, constipation and blood in stool.  Genitourinary: Negative for dysuria, hematuria, vaginal discharge, difficulty urinating and menstrual problem.  Musculoskeletal: Positive for back pain.  Skin: Negative for rash.  Neurological: Negative for syncope, weakness, light-headedness, numbness and headaches.  Psychiatric/Behavioral: Negative for confusion and dysphoric mood. The patient is not nervous/anxious.        Objective:   Physical Exam  Constitutional: Vital signs are normal. She appears well-developed and well-nourished. She is cooperative.  Non-toxic appearance. She does not appear ill. No distress.  HENT:  Head:  Normocephalic.  Right Ear: Hearing, tympanic membrane, external ear and ear canal normal.  Left Ear: Hearing, tympanic membrane, external ear and ear canal normal.  Nose: Nose normal.  Eyes: Conjunctivae, EOM and lids are normal. Pupils are equal, round, and reactive to light. No foreign bodies found.  Neck: Trachea normal and normal range of motion. Neck supple. Carotid bruit is not present. No mass and no thyromegaly present.  Cardiovascular: Normal rate, regular rhythm, S1 normal, S2 normal, normal heart sounds and intact distal pulses.  Exam reveals no gallop.   No murmur heard. Pulmonary/Chest: Effort normal and breath sounds normal. No respiratory distress. She has no wheezes. She has no rhonchi. She has no rales.  Abdominal: Soft. Normal appearance and bowel sounds are normal. She exhibits no distension, no fluid wave, no abdominal bruit and no mass. There is no hepatosplenomegaly. There is no tenderness. There is no rebound, no guarding and no CVA tenderness. No hernia.  Lymphadenopathy:    She has no cervical adenopathy.    She has no axillary adenopathy.  Neurological: She is alert. She has normal strength. No cranial nerve deficit or sensory deficit.  Skin: Skin is warm, dry and intact. No rash noted.  Psychiatric: Her speech is normal and behavior is normal. Judgment normal. Her mood appears not anxious. Cognition and memory are normal. She does not exhibit a depressed mood.          Assessment & Plan:  The patient's preventative maintenance and recommended screening tests for an annual wellness exam were reviewed in full today. Brought up to date unless services declined.  Counselled on the  importance of diet, exercise, and its role in overall health and mortality. The patient's FH and SH was reviewed, including their home life, tobacco status, and drug and alcohol status.   Vaccines: Uptodate with Tdap. Mammo:per GYN, 11/10/2011 abnormal, but diagnostic nml  PAP/DVE: per  GYN, 05/2012

## 2012-06-10 NOTE — Assessment & Plan Note (Signed)
NSAIDs, muscle relaxant, home PT, call if interested in referral for steroid injections.

## 2012-06-10 NOTE — Patient Instructions (Addendum)
Work on increasing exercsie and low fat/ low cholesterol diet. If back pain is increasing.. Call for referral for spinal injections.

## 2012-06-10 NOTE — Assessment & Plan Note (Signed)
Well controlled. Discussed method to wean off nexium using prilosec 40 mg then 20 mg daily. Trigger avoidance.

## 2012-07-13 ENCOUNTER — Other Ambulatory Visit: Payer: Self-pay | Admitting: Family Medicine

## 2012-07-13 NOTE — Telephone Encounter (Signed)
Electronic refill request Nexium; note in 06/10/12 visit about tapering off Nexium. Is OK to refill?

## 2012-07-14 ENCOUNTER — Encounter: Payer: Self-pay | Admitting: Family Medicine

## 2012-07-14 ENCOUNTER — Ambulatory Visit (INDEPENDENT_AMBULATORY_CARE_PROVIDER_SITE_OTHER): Payer: Managed Care, Other (non HMO) | Admitting: Family Medicine

## 2012-07-14 VITALS — BP 118/68 | HR 96 | Temp 97.8°F | Wt 130.0 lb

## 2012-07-14 DIAGNOSIS — K1379 Other lesions of oral mucosa: Secondary | ICD-10-CM

## 2012-07-14 DIAGNOSIS — K1370 Unspecified lesions of oral mucosa: Secondary | ICD-10-CM

## 2012-07-14 DIAGNOSIS — J0100 Acute maxillary sinusitis, unspecified: Secondary | ICD-10-CM

## 2012-07-14 MED ORDER — AMOXICILLIN-POT CLAVULANATE 875-125 MG PO TABS: 1.0000 | ORAL_TABLET | Freq: Two times a day (BID) | ORAL | Status: DC

## 2012-07-14 MED ORDER — FLUTICASONE PROPIONATE 50 MCG/ACT NA SUSP: 2.0000 | Freq: Every day | NASAL | Status: DC

## 2012-07-14 NOTE — Progress Notes (Signed)
Nature conservation officer at [facility] [address] [place] [address] Phone: [phone] Fax: [phone]  Date:  07/14/2012   Name:  Seraya Jobst   DOB:  [birth year]   MRN:  [id number] Gender: female Age: 46 y.o.  PCP:  Kerby Nora, MD    Chief Complaint: rash on gums   History of Present Illness:  [name] is a 46 y.o. pleasant patient who presents with the following:  Monday top of mouth, near teeth got sore. Then Tuesday got more sore. Did some salt water gargles. Wed got up and it was still sore. Then had an ache all the way up i her side. Glad was really sore on the left, behind face.  Primary complaint is with bleeding and pain behind an inferior to her left eye. She is also having some ear pain is having a swollen, painful lymph node on the left side of her anterior cervical chain.  Dentist, yest, tapped on all teeth, has a rash on the top of her mouth. Had normal xrays.   Patient Active Problem List  Diagnosis  . PANCREAS DIVISUM  . GERD (gastroesophageal reflux disease)  . Allergic rhinitis  . Heart murmur  . Migraine  . HX of Herniated vertebral disc  . Low back pain    Past Medical History  Diagnosis Date  . Chest pain   . Congenital anomalies of pancreas     Past Surgical History  Procedure Date  . Appendectomy   . Oophorectomy     Right  . Rotator cuff repair     History  Substance Use Topics  . Smoking status: Never Smoker   . Smokeless tobacco: Never Used  . Alcohol Use: Not on file    Family History  Problem Relation Age of Onset  . Emphysema Paternal Grandfather   . Allergies Mother   . Asthma Mother   . Hypertension Mother   . Heart disease Maternal Grandmother   . Lymphoma Maternal Grandfather   . Breast cancer Maternal Aunt   . Hyperlipidemia Father   . Heart disease Father 37  . Hypothyroidism Sister 11    post surgical    Allergies  Allergen Reactions  . Sulfonamide Derivatives     Medication  list has been reviewed and updated.  Outpatient Prescriptions Prior to Visit  Medication Sig Dispense Refill  . aspirin 81 MG tablet Take 81 mg by mouth at bedtime.        . baclofen (LIORESAL) 10 MG tablet Take 10 mg by mouth 2 (two) times daily.        . Cyanocobalamin (VITAMIN B 12 PO) Take 1,000 mcg by mouth daily.      [name] ibuprofen (ADVIL) 200 MG tablet Take 600 mg by mouth every 6 (six) hours as needed.      [name] NEXIUM 40 MG capsule TAKE 1 CAPSULE EVERY DAY  30 capsule  1  . gabapentin (NEURONTIN) 100 MG capsule Take 1 capsule (100 mg total) by mouth 3 (three) times daily.  90 capsule  0    Review of Systems:  ROS: GEN: Acute illness details above GI: Tolerating PO intake GU: maintaining adequate hydration and urination Pulm: No SOB Interactive and getting along well at home.  Otherwise, ROS is as per the HPI.   Physical Examination: Filed Vitals:   07/14/12 1024  BP: 118/68  Pulse: 96  Temp: 97.8 F (36.6 C)   Filed Vitals:   07/14/12 1024  Weight:  130 lb (58.968 kg)   There is no height on file to calculate BMI. Ideal Body Weight:     Gen: WDWN, NAD; alert,appropriate and cooperative throughout exam  HEENT: Normocephalic and atraumatic. Throat clear, w/o exudate. Left side of upper mouth with some small pitted appearing rash, L + LAD anterior chain and tender, R TM clear, L TM - good landmarks, No fluid present. No rhinnorhea.  Left frontal and maxillary sinuses: Tender at max significantly Right frontal and maxillary sinuses: non-Tender  Neck: No ant or post LAD CV: RRR, No M/G/R Pulm: Breathing comfortably in no resp distress. no w/c/r Abd: S,NT,ND,+BS Extr: no c/c/e Psych: full affect, pleasant   Assessment and Plan:  1. Sinusitis, acute maxillary   2. Mouth pain    Consistent with maxillary sinusitis. Treat with Augmentin.  Reassuring that the patient was seen by her dentist yesterday, and they did not feel like this was a dental abscess.  Orders  Today:  No orders of the defined types were placed in this encounter.    Updated Medication List: (Includes new medications, updates to list, dose adjustments) Meds ordered this encounter  Medications  . amoxicillin-clavulanate (AUGMENTIN) 875-125 MG per tablet    Sig: Take 1 tablet by mouth 2 (two) times daily.    Dispense:  20 tablet    Refill:  0  . fluticasone (FLONASE) 50 MCG/ACT nasal spray    Sig: Place 2 sprays into the nose daily.    Dispense:  16 g    Refill:  0    Medications Discontinued: Medications Discontinued During This Encounter  Medication Reason  . gabapentin (NEURONTIN) 100 MG capsule Therapy completed     Hannah Beat, MD

## 2012-08-08 ENCOUNTER — Telehealth: Payer: Self-pay

## 2012-08-08 NOTE — Telephone Encounter (Signed)
Pt wanted to know if she needed tdap.advised pt she received tdap on 06/07/12.

## 2012-08-10 ENCOUNTER — Ambulatory Visit (INDEPENDENT_AMBULATORY_CARE_PROVIDER_SITE_OTHER): Payer: Managed Care, Other (non HMO)

## 2012-08-10 DIAGNOSIS — Z23 Encounter for immunization: Secondary | ICD-10-CM

## 2012-09-11 ENCOUNTER — Encounter (HOSPITAL_COMMUNITY): Payer: Self-pay | Admitting: *Deleted

## 2012-09-11 ENCOUNTER — Observation Stay (HOSPITAL_COMMUNITY)
Admission: EM | Admit: 2012-09-11 | Discharge: 2012-09-11 | Disposition: A | Discharge status: Home / Self Care | Payer: Managed Care, Other (non HMO) | Attending: Emergency Medicine | Admitting: Emergency Medicine

## 2012-09-11 ENCOUNTER — Emergency Department (INDEPENDENT_AMBULATORY_CARE_PROVIDER_SITE_OTHER)
Admission: EM | Admit: 2012-09-11 | Discharge: 2012-09-11 | Disposition: A | Discharge status: Nursing Home (Includes ICF) | Payer: Managed Care, Other (non HMO) | Source: Home / Self Care

## 2012-09-11 ENCOUNTER — Encounter (HOSPITAL_COMMUNITY): Payer: Self-pay | Admitting: Emergency Medicine

## 2012-09-11 DIAGNOSIS — R21 Rash and other nonspecific skin eruption: Secondary | ICD-10-CM | POA: Insufficient documentation

## 2012-09-11 DIAGNOSIS — Z7982 Long term (current) use of aspirin: Secondary | ICD-10-CM | POA: Insufficient documentation

## 2012-09-11 DIAGNOSIS — L299 Pruritus, unspecified: Secondary | ICD-10-CM | POA: Insufficient documentation

## 2012-09-11 DIAGNOSIS — Z79899 Other long term (current) drug therapy: Secondary | ICD-10-CM | POA: Insufficient documentation

## 2012-09-11 DIAGNOSIS — Z87738 Personal history of other specified (corrected) congenital malformations of digestive system: Secondary | ICD-10-CM | POA: Insufficient documentation

## 2012-09-11 DIAGNOSIS — L509 Urticaria, unspecified: Secondary | ICD-10-CM | POA: Insufficient documentation

## 2012-09-11 DIAGNOSIS — R61 Generalized hyperhidrosis: Secondary | ICD-10-CM | POA: Insufficient documentation

## 2012-09-11 DIAGNOSIS — T7840XA Allergy, unspecified, initial encounter: Secondary | ICD-10-CM

## 2012-09-11 DIAGNOSIS — L272 Dermatitis due to ingested food: Principal | ICD-10-CM | POA: Insufficient documentation

## 2012-09-11 DIAGNOSIS — Z8719 Personal history of other diseases of the digestive system: Secondary | ICD-10-CM | POA: Insufficient documentation

## 2012-09-11 DIAGNOSIS — K219 Gastro-esophageal reflux disease without esophagitis: Secondary | ICD-10-CM | POA: Insufficient documentation

## 2012-09-11 DIAGNOSIS — R0602 Shortness of breath: Secondary | ICD-10-CM | POA: Insufficient documentation

## 2012-09-11 DIAGNOSIS — R221 Localized swelling, mass and lump, neck: Secondary | ICD-10-CM | POA: Insufficient documentation

## 2012-09-11 DIAGNOSIS — R232 Flushing: Secondary | ICD-10-CM | POA: Insufficient documentation

## 2012-09-11 DIAGNOSIS — T7800XA Anaphylactic reaction due to unspecified food, initial encounter: Secondary | ICD-10-CM

## 2012-09-11 DIAGNOSIS — R220 Localized swelling, mass and lump, head: Secondary | ICD-10-CM | POA: Insufficient documentation

## 2012-09-11 HISTORY — DX: Gastro-esophageal reflux disease without esophagitis: K21.9

## 2012-09-11 HISTORY — DX: Dyskinesia of esophagus: K22.4

## 2012-09-11 MED ORDER — METHYLPREDNISOLONE SODIUM SUCC 125 MG IJ SOLR
125.0000 mg | Freq: Once | INTRAMUSCULAR | Status: AC
  Administered 2012-09-11: 125 mg via INTRAVENOUS

## 2012-09-11 MED ORDER — SODIUM CHLORIDE 0.9 % IV SOLN: 20.0000 mL | INTRAVENOUS | Status: DC

## 2012-09-11 MED ORDER — EPINEPHRINE 0.3 MG/0.3ML IJ DEVI
0.3000 mg | Freq: Once | INTRAMUSCULAR | Status: AC
  Administered 2012-09-11: 0.3 mg via INTRAMUSCULAR
  Filled 2012-09-11: qty 0.3

## 2012-09-11 MED ORDER — EPINEPHRINE 0.3 MG/0.3ML IJ DEVI: 0.3000 mg | INTRAMUSCULAR | Status: DC | PRN

## 2012-09-11 MED ORDER — DIPHENHYDRAMINE HCL 25 MG PO CAPS
50.0000 mg | ORAL_CAPSULE | Freq: Once | ORAL | Status: AC
  Administered 2012-09-11: 50 mg via ORAL
  Filled 2012-09-11: qty 2

## 2012-09-11 MED ORDER — METHYLPREDNISOLONE SODIUM SUCC 125 MG IJ SOLR
INTRAMUSCULAR | Status: AC
  Filled 2012-09-11: qty 2

## 2012-09-11 MED ORDER — EPINEPHRINE HCL 0.1 MG/ML IJ SOLN
0.1000 mg | Freq: Once | INTRAMUSCULAR | Status: AC
  Administered 2012-09-11: 0.1 mg via SUBCUTANEOUS

## 2012-09-11 MED ORDER — EPINEPHRINE HCL 1 MG/ML IJ SOLN
INTRAMUSCULAR | Status: AC
  Filled 2012-09-11: qty 1

## 2012-09-11 MED ORDER — EPINEPHRINE HCL 0.1 MG/ML IJ SOLN: 0.1000 mg | Freq: Once | INTRAMUSCULAR | Status: DC

## 2012-09-11 NOTE — ED Provider Notes (Signed)
Medical screening examination/treatment/procedure(s) were performed by non-physician practitioner and as supervising physician I was immediately available for consultation/collaboration.  John-Adam Teoman Giraud, M.D.     John-Adam Tregan Read, MD 09/11/12 2340 

## 2012-09-11 NOTE — ED Provider Notes (Signed)
History     CSN: [id number]  Arrival date & time 09/11/12  1608   First MD Initiated Contact with Patient 09/11/12 1621      Chief Complaint  Patient presents with  . Allergic Reaction    (Consider location/radiation/quality/duration/timing/severity/associated sxs/prior treatment) The history is provided by the patient, the spouse and medical records.    [name] is a 46 y.o. female  with a hx of nut allergies presents to the Emergency Department complaining of sudden, persistent, progressively worsening reaction onset allergic reaction.  Patient ate oatmeal raisin cookie that has wants and it for which she was unaware. She states that she immediately began to have swelling of her throat and itching. Associated symptoms include hives, itching, swelling of her throat, shortness of breath, flushing of her skin, swelling of her lips.  Benadryl makes it better and nothing makes it worse.  Pt denies fever, chills, headache, chest pain, abdominal pain, nausea, vomiting, diarrhea, weakness, syncope.  Patient presented to the urgent care where she was given 0.1 mg as subcutaneous epi, Solu-Medrol 125 mg and she had taken 25 mg of Benadryl by mouth prior to arrival at the urgent care.     Past Medical History  Diagnosis Date  . Chest pain   . Congenital anomalies of pancreas   . GERD (gastroesophageal reflux disease)   . Esophageal spasm     Past Surgical History  Procedure Date  . Appendectomy   . Oophorectomy     Right  . Rotator cuff repair     Family History  Problem Relation Age of Onset  . Emphysema Paternal Grandfather   . Allergies Mother   . Asthma Mother   . Hypertension Mother   . Heart disease Maternal Grandmother   . Lymphoma Maternal Grandfather   . Breast cancer Maternal Aunt   . Hyperlipidemia Father   . Heart disease Father 58  . Hypothyroidism Sister 65    post surgical    History  Substance Use Topics  . Smoking status: Never Smoker   .  Smokeless tobacco: Never Used  . Alcohol Use: No    OB History    Grav Para Term Preterm Abortions TAB SAB Ect Mult Living                  Review of Systems  Constitutional: Positive for diaphoresis. Negative for fever, appetite change, fatigue and unexpected weight change.  HENT: Negative for mouth sores and neck stiffness.   Eyes: Negative for visual disturbance.  Respiratory: Positive for shortness of breath. Negative for cough, chest tightness and wheezing.   Cardiovascular: Negative for chest pain.  Gastrointestinal: Negative for nausea, vomiting, abdominal pain, diarrhea and constipation.  Genitourinary: Negative for dysuria, urgency, frequency and hematuria.  Skin: Positive for color change and rash.  Neurological: Negative for syncope, light-headedness and headaches.  Psychiatric/Behavioral: Negative for sleep disturbance. The patient is not nervous/anxious.   All other systems reviewed and are negative.    Allergies  Other and Sulfonamide derivatives  Home Medications   Current Outpatient Rx  Name  Route  Sig  Dispense  Refill  . ASPIRIN 81 MG PO TABS   Oral   Take 81 mg by mouth at bedtime.           [name] BACLOFEN 10 MG PO TABS   Oral   Take 10 mg by mouth 2 (two) times daily.           [name] VITAMIN B 12  PO   Oral   Take 1,000 mcg by mouth daily.         . IBUPROFEN 200 MG PO TABS   Oral   Take 600 mg by mouth every 6 (six) hours as needed.         [name] NEXIUM 40 MG PO CPDR      TAKE 1 CAPSULE EVERY DAY   30 capsule   1   . EPINEPHRINE 0.3 MG/0.3ML IJ DEVI   Intramuscular   Inject 0.3 mLs (0.3 mg total) into the muscle as needed.   1 Device   3     BP 111/61  Pulse 108  Temp 98.3 F (36.8 C) (Oral)  Resp 18  SpO2 96%  Physical Exam  Nursing note and vitals reviewed. Constitutional: She appears well-developed and well-nourished. No distress.  HENT:  Head: Normocephalic and atraumatic.  Right Ear: Tympanic membrane, external ear and  ear canal normal.  Left Ear: Tympanic membrane, external ear and ear canal normal.  Nose: Nose normal. Right sinus exhibits no maxillary sinus tenderness and no frontal sinus tenderness. Left sinus exhibits no maxillary sinus tenderness and no frontal sinus tenderness.  Mouth/Throat: Uvula is midline. Mucous membranes are not pale, not dry and not cyanotic. Posterior oropharyngeal erythema present. No oropharyngeal exudate, posterior oropharyngeal edema or tonsillar abscesses.       No stridor at rest or with deep breathing  Eyes: Conjunctivae normal are normal. Pupils are equal, round, and reactive to light. No scleral icterus.  Neck: Normal range of motion. Neck supple.  Cardiovascular: Regular rhythm, normal heart sounds and intact distal pulses.  Tachycardia present.  Exam reveals no gallop and no friction rub.   No murmur heard. Pulmonary/Chest: Effort normal and breath sounds normal. Not tachypneic. No respiratory distress. She has no decreased breath sounds. She has no wheezes. She has no rhonchi. She has no rales.  Abdominal: Soft. Bowel sounds are normal. She exhibits no distension and no mass. There is no tenderness. There is no rebound and no guarding.  Musculoskeletal: Normal range of motion. She exhibits no edema and no tenderness.  Neurological: She is alert.       Speech is clear and goal oriented Moves extremities without ataxia  Skin: Skin is warm and dry. Rash noted. She is not diaphoretic. There is erythema.       Patient with erythema of the neck, hands and arms and legs  Psychiatric: She has a normal mood and affect.    ED Course  Procedures (including critical care time)  Labs Reviewed - No data to display No results found.    1. Allergic reaction       MDM  Milton Ferguson versus allergic reaction.  Patient given Epi 0.1mg  SQ, Solu-Medrol 125mg  IV, and  Took digital 25 mg prior to presenting to the urgent care Center.  At evaluation, patient states itching  is returning and she continues to feels of her throat might close.    Confirmed that the urgent care Center gave patient only 0.1 mg of epinephrine subcutaneous therefore will redose epinephrine at the adult dose of 0.3 mg subcutaneous and Benadryl redosed IV.  Patient placed on the allergic reaction protocol in the CDU. Report given to [facility], PA-C.    Dahlia Client Jehiel Koepp, PA-C 09/12/12 [phone]

## 2012-09-11 NOTE — ED Notes (Signed)
Patient is resting comfortably. 

## 2012-09-11 NOTE — ED Notes (Signed)
Iv   Ns  tko      Via   20  Angio   r   Hand       1  Att

## 2012-09-11 NOTE — ED Provider Notes (Signed)
Patient with a hx sig for nut allergy was placed in CDU on allergy protocol by Dahlia Client, PA-C. Patient care resumed from Pod A .  Patient is here for observation and has received epi, steroids, pepcid & benadryl. While in obeservation over night the pt slept well and had no complaints, per nursing staff. Patient re-evaluated and is resting comfortable, VSS, with no new complaints or concerns at this time. Plan per previous provider is to dc pt w epi pen Rx after obersvation. On exam: hemodynamically stable, NAD, heart w/ RRR, lungs CTAB, Chest & abd non-tender, no peripheral edema or calf tenderness.   8:29 PM  Patient re-evaluated prior to dc, is hemodynamically stable, in no respiratory distress, and denies the feeling of throat closing. Pt has been advised to take OTC benadryl & return to the ED if they have a mod-severe allergic rxn (s/s including throat closing, difficulty breathing, swelling of lips face or tongue). Pt is to follow up with their PCP. Pt is agreeable with plan & verbalizes understanding.   Jaci Carrel, [place] 09/11/12 2029

## 2012-09-11 NOTE — ED Notes (Signed)
Pt states that around 12:15 today she ate a cookie that had walnuts in it. Hx of allergy to walnuts.   Pt skin is red and irritated, itching all over. Some discomfort with swallowing.   Pt has taking one dose of benedryl

## 2012-09-11 NOTE — ED Provider Notes (Signed)
History     CSN: [id number]  Arrival date & time 09/11/12  1509   None     Chief Complaint  Patient presents with  . Allergic Reaction    pt allergic to nuts had cookie around 12:15 today    (Consider location/radiation/quality/duration/timing/severity/associated sxs/prior treatment) HPI Comments: 46 year old female with a known history of allergy to nuts. Here complaining of generalized body redness, itchiness, swelling in her right lower lip and throat discomfort after eating a cookie containing peanuts at 12:00 afternoon today. Patient started initially with throat discomfort after eating the cookie and few hours later took a Benadryl 25 mg . Symptoms started to get worse after the Benadryl ingestion and she was brought to the urgent care by her family.    Past Medical History  Diagnosis Date  . Chest pain   . Congenital anomalies of pancreas     Past Surgical History  Procedure Date  . Appendectomy   . Oophorectomy     Right  . Rotator cuff repair     Family History  Problem Relation Age of Onset  . Emphysema Paternal Grandfather   . Allergies Mother   . Asthma Mother   . Hypertension Mother   . Heart disease Maternal Grandmother   . Lymphoma Maternal Grandfather   . Breast cancer Maternal Aunt   . Hyperlipidemia Father   . Heart disease Father 51  . Hypothyroidism Sister 79    post surgical    History  Substance Use Topics  . Smoking status: Never Smoker   . Smokeless tobacco: Never Used  . Alcohol Use: No    OB History    Grav Para Term Preterm Abortions TAB SAB Ect Mult Living                  Review of Systems  Constitutional: Positive for diaphoresis.  HENT: Positive for facial swelling and trouble swallowing.   Respiratory: Negative for cough, shortness of breath, wheezing and stridor.   Gastrointestinal: Negative for nausea, vomiting and abdominal pain.  Skin: Positive for rash.  Neurological: Negative for dizziness, seizures and  headaches.  Psychiatric/Behavioral: Negative for agitation.    Allergies  Peanuts and Sulfonamide derivatives  Home Medications   Current Outpatient Rx  Name  Route  Sig  Dispense  Refill  . BACLOFEN 10 MG PO TABS   Oral   Take 10 mg by mouth 2 (two) times daily.           [name] NEXIUM 40 MG PO CPDR      TAKE 1 CAPSULE EVERY DAY   30 capsule   1   . AMOXICILLIN-POT CLAVULANATE 875-125 MG PO TABS   Oral   Take 1 tablet by mouth 2 (two) times daily.   20 tablet   0   . ASPIRIN 81 MG PO TABS   Oral   Take 81 mg by mouth at bedtime.           [name] VITAMIN B 12 PO   Oral   Take 1,000 mcg by mouth daily.         [name] FLUTICASONE PROPIONATE 50 MCG/ACT NA SUSP   Nasal   Place 2 sprays into the nose daily.   16 g   0   . IBUPROFEN 200 MG PO TABS   Oral   Take 600 mg by mouth every 6 (six) hours as needed.           BP 133/66  Pulse 115  Temp 98 F (36.7 C) (Oral)  Resp 16  SpO2 100%  Physical Exam  Nursing note and vitals reviewed. Constitutional: She is oriented to person, place, and time. She appears well-developed and well-nourished.       Mild diaphoresis but calmed.  HENT:  Head: Normocephalic and atraumatic.       Lower lip swelling more on lower right side. Pharyngeal swelling and erythema no uvula edema. Airways are patent.   Eyes: Conjunctivae normal are normal. No scleral icterus.  Neck: Neck supple.  Cardiovascular: Regular rhythm.  Exam reveals no gallop and no friction rub.   No murmur heard.      Tachycardic   Pulmonary/Chest: Effort normal and breath sounds normal. No respiratory distress. She has no wheezes. She has no rales. She exhibits no tenderness.       No tachypnea, orthopnea or retractions. No respiratory distress.  Abdominal: Soft.  Neurological: She is alert and oriented to person, place, and time.  Skin: Rash noted.       Generalized erythema worse in the distal upper extremities and face associated with swelling and itchiness.      ED Course  Procedures (including critical care time)  Labs Reviewed - No data to display No results found.   1. Anaphylaxis due to food       MDM   46 year old female with a known history of allergy to nuts. Here complaining of generalized body redness, itchiness, swelling in her right lower lip and throat discomfort after eating a cookie containing peanuts at 12:00 afternoon today. On exam: Patient is slightly diaphoretic with generalized face and body erythema, mild to moderate face and and upper distal extremity swelling. Right lower lip swelling. No uvular edema, although pharyngeal erythema and edema and reported discomfort with swallowing. No drooling. Airways were patent. Patient alert. Vital signs with a heart rate in 120's, blood pressure 130s/ 60s. Patient had IV placed and was given 125 mg of IV Solu-Medrol and epinephrine in 0.1 subcutaneously. Normal saline to keep vein open. Erythema, itchiness and distal extremity and face swelling improved. Breathing comfortably. Sinus tachycardia with a heart rate on one times on heart monitor and blood pressure stable at the time of discharge. It was decided to transfer patient to the emergency department via [facility] for further evaluation and management.        Sharin Grave, MD 09/11/12 1642

## 2012-09-11 NOTE — ED Notes (Signed)
[facility] called and in route  

## 2012-09-11 NOTE — ED Notes (Signed)
Per [facility]: PT has allergies to nuts. Pt states ate oatmeal cookie with nuts that was unmarked around 1215; went to [facility]. Pt presented to [facility] with breathing difficulty, rash to entire body. 0.1 SQ epi given [facility] at 1532 and125 mg Solumedrol. HR 106, 2L [place] 100%. Lung sounds clear; no stridor. 20 L hand. NAD. Family at bedside.

## 2012-09-11 NOTE — ED Notes (Signed)
Family at bedside. 

## 2012-09-13 NOTE — ED Provider Notes (Signed)
Medical screening examination/treatment/procedure(s) were performed by non-physician practitioner and as supervising physician I was immediately available for consultation/collaboration.  Jones Skene, M.D.     Jones Skene, MD 09/13/12 0150

## 2012-09-15 ENCOUNTER — Other Ambulatory Visit: Payer: Self-pay | Admitting: Family Medicine

## 2012-10-27 ENCOUNTER — Ambulatory Visit (INDEPENDENT_AMBULATORY_CARE_PROVIDER_SITE_OTHER): Payer: Managed Care, Other (non HMO) | Admitting: Family Medicine

## 2012-10-27 ENCOUNTER — Encounter: Payer: Self-pay | Admitting: Family Medicine

## 2012-10-27 VITALS — BP 130/80 | HR 90 | Temp 98.3°F | Ht 62.0 in | Wt 135.5 lb

## 2012-10-27 DIAGNOSIS — M545 Low back pain: Secondary | ICD-10-CM

## 2012-10-27 MED ORDER — BACLOFEN 10 MG PO TABS: 20.0000 mg | ORAL_TABLET | Freq: Two times a day (BID) | ORAL | Status: DC

## 2012-10-27 MED ORDER — DICLOFENAC SODIUM 75 MG PO TBEC: 75.0000 mg | DELAYED_RELEASE_TABLET | Freq: Two times a day (BID) | ORAL | Status: DC

## 2012-10-27 NOTE — Patient Instructions (Addendum)
Stop ibuprofen. Start diclofenac twice daily. Increase baclofen to 20 mg twice daily. Heat on low back and start low back stretches. Call if not improving few days, follow up if not significantly better in 2 weeks.

## 2012-10-27 NOTE — Progress Notes (Signed)
  Subjective:    Patient ID: [name], female    DOB: 1966-09-29, 47 y.o.   MRN: [id number]  HPI  47 year old female with history of chronic low back pain ( herniated disc, last  Lumbar MRI 2012: Mild multilevel lumbar spondylosis and mild facet arthrosis) presents with new injury. She was helping 75 lb dog up the stairs, bent over, pushed dog up. Could not stand up after that. Pain is central low back, on pain scale 8/10.  No radicular symptoms, no weakness, no numbness.  Occ left shoulder pain off and on today and in the past.  Using advil this AM but she used this for chronic back apin before she hurt back today.     Review of Systems  Constitutional: Negative for fever and fatigue.  HENT: Negative for ear pain.   Eyes: Negative for pain.  Respiratory: Negative for chest tightness and shortness of breath.   Cardiovascular: Negative for chest pain, palpitations and leg swelling.  Gastrointestinal: Negative for abdominal pain.  Genitourinary: Negative for dysuria.  Musculoskeletal: Positive for back pain.       Objective:   Physical Exam  Constitutional: Vital signs are normal. She appears well-developed and well-nourished. She is cooperative.  Non-toxic appearance. She does not appear ill. No distress.  HENT:  Head: Normocephalic.  Right Ear: Hearing, tympanic membrane and ear canal normal. Tympanic membrane is not erythematous, not retracted and not bulging.  Left Ear: Hearing, tympanic membrane and ear canal normal. Tympanic membrane is not erythematous, not retracted and not bulging.  Nose: No mucosal edema or rhinorrhea. Right sinus exhibits no maxillary sinus tenderness and no frontal sinus tenderness. Left sinus exhibits no maxillary sinus tenderness and no frontal sinus tenderness.  Mouth/Throat: Uvula is midline and mucous membranes are normal.  Eyes: Conjunctivae normal and lids are normal. No foreign bodies found.  Neck: Trachea normal and normal range of  motion. Neck supple. Carotid bruit is not present. No mass and no thyromegaly present.  Cardiovascular: Normal rate, regular rhythm, S1 normal, S2 normal, normal heart sounds, intact distal pulses and normal pulses.  Exam reveals no gallop and no friction rub.   No murmur heard. Pulmonary/Chest: Effort normal and breath sounds normal. Not tachypneic. No respiratory distress. She has no decreased breath sounds. She has no wheezes. She has no rhonchi. She has no rales.  Abdominal: Normal appearance.  Musculoskeletal:       Cervical back: She exhibits tenderness. She exhibits normal range of motion and no bony tenderness.       Thoracic back: Normal.       Lumbar back: She exhibits decreased range of motion, tenderness, pain and spasm. She exhibits no bony tenderness, no swelling and no deformity.       ttp over B paraspinous muscles, neg Faber's , neg SLR.  Neurological: She is alert.  Skin: Skin is warm, dry and intact. No rash noted.  Psychiatric: Her speech is normal and behavior is normal. Judgment and thought content normal. Her mood appears not anxious. Cognition and memory are normal. She does not exhibit a depressed mood.          Assessment & Plan:

## 2012-10-27 NOTE — Assessment & Plan Note (Signed)
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 Change to stronger NSAID, increase baclofen (she is on this for esophageal spasm), heat, and start gentle stretching.  If not responding in next 48-72 hours can try stronger med for pain such as tramadol.  If not improving in 2 weeks  Follow up and consider  X-ray and PT.

## 2012-11-07 ENCOUNTER — Encounter: Payer: Self-pay | Admitting: Family Medicine

## 2012-11-16 ENCOUNTER — Other Ambulatory Visit: Payer: Self-pay | Admitting: Family Medicine

## 2013-01-13 ENCOUNTER — Other Ambulatory Visit: Payer: Self-pay | Admitting: Family Medicine

## 2013-02-13 ENCOUNTER — Ambulatory Visit (INDEPENDENT_AMBULATORY_CARE_PROVIDER_SITE_OTHER): Payer: Managed Care, Other (non HMO) | Admitting: Family Medicine

## 2013-02-13 ENCOUNTER — Encounter: Payer: Self-pay | Admitting: Family Medicine

## 2013-02-13 VITALS — BP 120/74 | HR 83 | Temp 98.9°F | Ht 62.0 in | Wt 134.5 lb

## 2013-02-13 DIAGNOSIS — L259 Unspecified contact dermatitis, unspecified cause: Secondary | ICD-10-CM

## 2013-02-13 DIAGNOSIS — L239 Allergic contact dermatitis, unspecified cause: Secondary | ICD-10-CM

## 2013-02-13 MED ORDER — TRIAMCINOLONE ACETONIDE 0.1 % EX CREA: TOPICAL_CREAM | Freq: Two times a day (BID) | CUTANEOUS | Status: DC

## 2013-02-13 NOTE — Progress Notes (Signed)
Nature conservation officer at [facility] [address] [place] [address] Phone: [phone] Fax: [phone]  Date:  02/13/2013   Name:  [name]   DOB:  [birth year]   MRN:  [id number] Gender: female Age: 47 y.o.  Primary Physician:  Kerby Nora, MD  Evaluating MD: Hannah Beat, MD   Chief Complaint: Rash   History of Present Illness:  [name] is a 47 y.o. pleasant patient who presents with the following:  Arms, armpit, behind R ear - there is a fine, raised reddish rash on approximately 3/4 of the forearms bilaterally, and a small amount under the LEFT axilla as well as behind the RIGHT ear. This is pruritic. No known exposures. She was in the hospital for 5 days ago visiting family.  Patient Active Problem List   Diagnosis Date Noted  . Low back pain 05/29/2011  . GERD (gastroesophageal reflux disease) 02/20/2011  . Allergic rhinitis 02/20/2011  . Heart murmur 02/20/2011  . Migraine 02/20/2011  . HX of Herniated vertebral disc 02/20/2011  . 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 Chest pain   . Congenital anomalies of pancreas   . GERD (gastroesophageal reflux disease)   . Esophageal spasm     Past Surgical History  Procedure Laterality Date  . Appendectomy    . Oophorectomy      Right  . Rotator cuff repair      History   Social History  . Marital Status: Married    Spouse Name: N/A    Number of Children: 0  . Years of Education: N/A   Occupational History  . Not on file.   Social History Main Topics  . Smoking status: Never Smoker   . Smokeless tobacco: Never Used  . Alcohol Use: No  . Drug Use: Not on file  . Sexually Active: Not on file   Other Topics Concern  . Not on file   Social History Narrative  . No narrative on file    Family History  Problem Relation Age of Onset  . Emphysema Paternal Grandfather   . Allergies Mother   . Asthma Mother   . Hypertension Mother   .  Heart disease Maternal Grandmother   . Lymphoma Maternal Grandfather   . Breast cancer Maternal Aunt   . Hyperlipidemia Father   . Heart disease Father 16  . Hypothyroidism Sister 29    post surgical    Allergies  Allergen Reactions  . Other     Walnut and [employer]  . Sulfonamide Derivatives     Medication list has been reviewed and updated.  Outpatient Prescriptions Prior to Visit  Medication Sig Dispense Refill  . aspirin 81 MG tablet Take 81 mg by mouth at bedtime.        . baclofen (LIORESAL) 10 MG tablet Take 2 tablets (20 mg total) by mouth 2 (two) times daily.  30 each  0  . Cyanocobalamin (VITAMIN B 12 PO) Take 1,000 mcg by mouth daily.      [name] EPINEPHrine (EPIPEN) 0.3 mg/0.3 mL DEVI Inject 0.3 mLs (0.3 mg total) into the muscle as needed.  1 Device  3  . NEXIUM 40 MG capsule TAKE ONE CAPSULE EVERY DAY  30 capsule  1  . diclofenac (VOLTAREN) 75 MG EC tablet Take 1 tablet (75 mg total) by mouth 2 (two) times daily.  30 tablet  0  . ibuprofen (ADVIL) 200 MG tablet Take  600 mg by mouth every 6 (six) hours as needed.       No facility-administered medications prior to visit.    Review of Systems:   GEN: No acute illnesses, no fevers, chills. GI: No n/v/d, eating normally Pulm: No SOB Interactive and getting along well at home.  Otherwise, ROS is as per the HPI.   Physical Examination: BP 120/74  Pulse 83  Temp(Src) 98.9 F (37.2 C) (Oral)  Ht 5\' 2"  (1.575 m)  Wt 134 lb 8 oz (61.009 kg)  BMI 24.59 kg/m2  SpO2 98%  Ideal Body Weight: Weight in (lb) to have BMI = 25: 136.4   GEN: WDWN, NAD, Non-toxic, Alert & Oriented x 3 HEENT: Atraumatic, Normocephalic.  Ears and Nose: No external deformity. EXTR: No clubbing/cyanosis/edema NEURO: Normal gait.  PSYCH: Normally interactive. Conversant. Not depressed or anxious appearing.  Calm demeanor.  SKIN: as above, fine, raised rash, reddish. Forearms, axilla and behind ear.  Assessment and Plan:  Allergic  dermatitis  There is no clear mechanism, but by parents seems to be allergic in nature. We'll treat this with topical steroid. If it gets much worse, we could use some systemic steroids.  Orders Today:  No orders of the defined types were placed in this encounter.    Updated Medication List: (Includes new medications, updates to list, dose adjustments) Meds ordered this encounter  Medications  . triamcinolone cream (KENALOG) 0.1 %    Sig: Apply topically 2 (two) times daily.    Dispense:  454 g    Refill:  0    Medications Discontinued: Medications Discontinued During This Encounter  Medication Reason  . ibuprofen (ADVIL) 200 MG tablet Error  . diclofenac (VOLTAREN) 75 MG EC tablet Error      Signed, Farah Lepak T. Pearl Berlinger, MD 02/13/2013 3:17 PM

## 2013-03-10 ENCOUNTER — Telehealth: Payer: Self-pay | Admitting: Family Medicine

## 2013-03-10 NOTE — Telephone Encounter (Signed)
Patient Information:  Caller Name: Jamesa  Phone: [phone]  Patient: [name], [name]  Gender: Female  DOB: 10-Feb-1966  Age: 47 Years  PCP: Kerby Nora [facility])  Pregnant: No  Office Follow Up:  Does the office need to follow up with this patient?: No  Instructions For The Office: N/A  RN Note:  From CDC website " Shingles cannot be passed from one person to another. However, the virus that causes shingles, the varicella zoster virus, can be spread from a person with active shingles to another person who has never had chickenpox. In such cases, the person exposed to the virus might develop chickenpox, but they would not develop shingles.   The virus is spread through direct contact with fluid from the rash blisters caused by shingles.  A person with active shingles can spread the virus when the rash is in the blister-phase. A person is not infectious before the blisters appear. Once the rash has developed crusts, the person is no longer contagious.  Shingles is less contagious than chickenpox and the risk of a person with shingles spreading the virus is low if the rash is covered.  If you have shingles Keep the rash covered.  Avoid touching or scratching the rash.  Wash your hands often to prevent the spread of varicella zoster virus.  Until your rash has developed crusts, avoid contact with ?pregnant women who have never had chickenpox or the chickenpox vaccine; ?premature or low birth weight infants; and  ?people with weakened immune systems, such as people receiving immunosuppressive medications or undergoing chemotherapy, organ transplant recipients, and people with human immunodeficiency virus (HIV) infection. "  Symptoms  Reason For Call & Symptoms: p thas a question about shingles and how contagious it is.  Pt states her brother in law has the shingles and she will be seeing him today.  Pt reports she has had chicken pox as a child  Reviewed Health History In  EMR: No  Reviewed Medications In EMR: No  Reviewed Allergies In EMR: No  Reviewed Surgeries / Procedures: No  Date of Onset of Symptoms: Unknown OB / GYN:  LMP: Unknown  Guideline(s) Used:  No Protocol Available - Information Only  Disposition Per Guideline:   Home Care  Reason For Disposition Reached:   Information only question and nurse able to answer  Advice Given:  Call Back If:  New symptoms develop  Patient Will Follow Care Advice:  YES

## 2013-03-13 ENCOUNTER — Telehealth: Payer: Self-pay | Admitting: *Deleted

## 2013-03-13 ENCOUNTER — Other Ambulatory Visit: Payer: Self-pay | Admitting: Family Medicine

## 2013-03-13 NOTE — Telephone Encounter (Signed)
Patient called stating that her pharmacist told her that it would be cheaper to buy the Nexium OTC and take two. Patient wants to know what you think about that?  Patient states that she will not be home tomorrow, but wants message left on her home phone.

## 2013-03-14 NOTE — Telephone Encounter (Signed)
Patient advised via message on machine

## 2013-03-14 NOTE — Telephone Encounter (Signed)
That would be fine as long as it is same dose she is takng now, 40 mg daily.

## 2013-03-16 ENCOUNTER — Telehealth: Payer: Self-pay

## 2013-03-16 ENCOUNTER — Telehealth: Payer: Self-pay | Admitting: *Deleted

## 2013-03-16 DIAGNOSIS — R109 Unspecified abdominal pain: Secondary | ICD-10-CM

## 2013-03-16 DIAGNOSIS — R1011 Right upper quadrant pain: Secondary | ICD-10-CM

## 2013-03-16 NOTE — Telephone Encounter (Signed)
Note reviewed.

## 2013-03-16 NOTE — Telephone Encounter (Addendum)
Pt left note Dr Lanell Matar at [facility] has requested pt to have RUQ ultrasound of the gall bladder. [place]Please advise. Pt brought AVS from Dr Lanell Matar and this is in Dr Daphine Deutscher in box.

## 2013-03-16 NOTE — Telephone Encounter (Signed)
Need note from this MD.. Why can they not order test? I don't believe I have seen her for this issue.

## 2013-03-16 NOTE — Telephone Encounter (Signed)
Will change to complete abdomen

## 2013-03-17 ENCOUNTER — Ambulatory Visit
Admission: RE | Admit: 2013-03-17 | Discharge: 2013-03-17 | Disposition: A | Discharge status: Home / Self Care | Payer: Managed Care, Other (non HMO) | Source: Ambulatory Visit | Attending: Family Medicine | Admitting: Family Medicine

## 2013-03-17 ENCOUNTER — Other Ambulatory Visit: Payer: Managed Care, Other (non HMO)

## 2013-03-17 DIAGNOSIS — R109 Unspecified abdominal pain: Secondary | ICD-10-CM

## 2013-04-04 ENCOUNTER — Telehealth: Payer: Self-pay

## 2013-04-04 NOTE — Telephone Encounter (Signed)
Pt said Dr Lanell Matar did not receive faxed [place] Abd report. refaxed to Dr Lanell Matar attention at [phone]. Pt advised refaxed.

## 2013-06-05 ENCOUNTER — Other Ambulatory Visit: Payer: Managed Care, Other (non HMO)

## 2013-06-06 ENCOUNTER — Telehealth: Payer: Self-pay | Admitting: Family Medicine

## 2013-06-06 DIAGNOSIS — Z13220 Encounter for screening for lipoid disorders: Secondary | ICD-10-CM

## 2013-06-06 NOTE — Telephone Encounter (Signed)
Message copied by Excell Seltzer on Tue Jun 06, 2013  5:22 PM ------      Message from: [name]      Created: Wed May 31, 2013  3:24 PM      Regarding: Lab orders for Wednesday, 8.27.14       Patient is scheduled for CPX labs, please order future labs, Thanks , Terri       ------

## 2013-06-08 ENCOUNTER — Other Ambulatory Visit (INDEPENDENT_AMBULATORY_CARE_PROVIDER_SITE_OTHER): Payer: Managed Care, Other (non HMO)

## 2013-06-08 DIAGNOSIS — Z13220 Encounter for screening for lipoid disorders: Secondary | ICD-10-CM

## 2013-06-09 LAB — COMPREHENSIVE METABOLIC PANEL
ALT: 17 U/L (ref 0–35)
AST: 20 U/L (ref 0–37)
Albumin: 4.5 g/dL (ref 3.5–5.2)
Alkaline Phosphatase: 54 U/L (ref 39–117)
BUN: 12 mg/dL (ref 6–23)
CO2: 30 mEq/L (ref 19–32)
Calcium: 9.3 mg/dL (ref 8.4–10.5)
Chloride: 106 mEq/L (ref 96–112)
Creatinine, Ser: 0.5 mg/dL (ref 0.4–1.2)
GFR: 136.79 mL/min (ref >60.00)
Glucose, Bld: 86 mg/dL (ref 70–99)
Potassium: 3.9 mEq/L (ref 3.5–5.1)
Sodium: 140 mEq/L (ref 135–145)
Total Bilirubin: 0.5 mg/dL (ref 0.3–1.2)
Total Protein: 7 g/dL (ref 6.0–8.3)

## 2013-06-09 LAB — LIPID PANEL
Cholesterol: 221 mg/dL — ABNORMAL HIGH (ref 0–200)
HDL: 78.9 mg/dL (ref >39.00)
Total CHOL/HDL Ratio: 3
Triglycerides: 70 mg/dL (ref 0.0–149.0)
VLDL: 14 mg/dL (ref 0.0–40.0)

## 2013-06-09 LAB — LDL CHOLESTEROL, DIRECT: Direct LDL: 122.8 mg/dL

## 2013-06-16 ENCOUNTER — Ambulatory Visit (INDEPENDENT_AMBULATORY_CARE_PROVIDER_SITE_OTHER): Payer: Managed Care, Other (non HMO) | Admitting: Family Medicine

## 2013-06-16 ENCOUNTER — Encounter: Payer: Self-pay | Admitting: Family Medicine

## 2013-06-16 VITALS — BP 110/64 | HR 80 | Temp 98.2°F | Ht 62.0 in | Wt 135.5 lb

## 2013-06-16 DIAGNOSIS — K219 Gastro-esophageal reflux disease without esophagitis: Secondary | ICD-10-CM

## 2013-06-16 DIAGNOSIS — R1013 Epigastric pain: Secondary | ICD-10-CM | POA: Insufficient documentation

## 2013-06-16 DIAGNOSIS — M545 Low back pain: Secondary | ICD-10-CM

## 2013-06-16 LAB — LIPASE: Lipase: 30 U/L (ref 11.0–59.0)

## 2013-06-16 MED ORDER — EPINEPHRINE 0.3 MG/0.3ML IJ SOAJ: 0.3000 mg | INTRAMUSCULAR | Status: DC | PRN

## 2013-06-16 NOTE — Assessment & Plan Note (Signed)
Resolved

## 2013-06-16 NOTE — Assessment & Plan Note (Signed)
Likely cause of abdominal pain.

## 2013-06-16 NOTE — Patient Instructions (Addendum)
Try zantac or pepcid AC at time of abdominal pain for possible acid issues. If not improving return to see GI. Stop at lab on your way out. Get back to regular exercise. Follow up CPX in 1 year.

## 2013-06-16 NOTE — Progress Notes (Signed)
  Subjective:    Patient ID: [name], female    DOB: 06-18-1966, 47 y.o.   MRN: [id number]  HPI  The patient is here for annual  Chronic health maintenance. She sees GYN for CPX  Followed by Dr. Lanell Matar, GI for esophageal spasm ( on baclofen). She saw him earlier in the year with 10/10 pain in epigastrum. US gallbladder was negative. He never called her back for further follow up. She has not had any severe episodes but does have once a week pain, more mild. No associate N/D, occ has C. No fever. Has associated indigestion at the same time. Occurs at night, gone by the next morning. No relationship to diet.    She is on nexium 40 mg daily.. Had ENDO in 09/2012.  Aunt with pancreas issues, ? CA.  Lab Results  Component Value Date   CHOL 221* 06/08/2013   HDL 78.90 06/08/2013   LDLDIRECT 122.8 06/08/2013   TRIG 70.0 06/08/2013   CHOLHDL 3 06/08/2013  Reviewed in detail.  Minimal exercise.  healthy diet.    Review of Systems  Constitutional: Negative for fever and fatigue.  HENT: Negative for ear pain.   Respiratory: Negative for chest tightness and shortness of breath.   Cardiovascular: Negative for chest pain, palpitations and leg swelling.  Gastrointestinal: Negative for abdominal pain.  Genitourinary: Negative for dysuria.       Objective:   Physical Exam  Constitutional: Vital signs are normal. She appears well-developed and well-nourished. She is cooperative.  Non-toxic appearance. She does not appear ill. No distress.  HENT:  Head: Normocephalic.  Right Ear: Hearing, tympanic membrane, external ear and ear canal normal.  Left Ear: Hearing, tympanic membrane, external ear and ear canal normal.  Nose: Nose normal.  Eyes: Conjunctivae, EOM and lids are normal. Pupils are equal, round, and reactive to light. Lids are everted and swept, no foreign bodies found.  Neck: Trachea normal and normal range of motion. Neck supple. Carotid bruit is not present. No mass  and no thyromegaly present.  Cardiovascular: Normal rate, regular rhythm, S1 normal, S2 normal, normal heart sounds and intact distal pulses.  Exam reveals no gallop.   No murmur heard. Pulmonary/Chest: Effort normal and breath sounds normal. No respiratory distress. She has no wheezes. She has no rhonchi. She has no rales.  Abdominal: Soft. Normal appearance and bowel sounds are normal. She exhibits no distension, no fluid wave, no abdominal bruit and no mass. There is no hepatosplenomegaly. There is no tenderness. There is no rebound, no guarding and no CVA tenderness. No hernia.  Lymphadenopathy:    She has no cervical adenopathy.    She has no axillary adenopathy.  Neurological: She is alert. She has normal strength. No cranial nerve deficit or sensory deficit.  Skin: Skin is warm, dry and intact. No rash noted.  Psychiatric: Her speech is normal and behavior is normal. Judgment normal. Her mood appears not anxious. Cognition and memory are normal. She does not exhibit a depressed mood.          Assessment & Plan:  The patient's preventative maintenance and recommended screening tests for an annual wellness exam were reviewed in full today. Brought up to date unless services declined.  Counselled on the importance of diet, exercise, and its role in overall health and mortality. The patient's FH and SH was reviewed, including their home life, tobacco status, and drug and alcohol status.   Vaccines:uptodate PAP/DVE: See Gyn, nml exam Mammo:10/2012

## 2013-06-16 NOTE — Assessment & Plan Note (Signed)
Check lipase. Start H2 blocker prn in addition to PPI.  if not improving follow up with GI.

## 2013-07-20 ENCOUNTER — Telehealth: Payer: Self-pay

## 2013-07-20 DIAGNOSIS — E781 Pure hyperglyceridemia: Secondary | ICD-10-CM

## 2013-07-20 NOTE — Telephone Encounter (Signed)
Trig fluctuate very radiply with diet changes... May be elevated due to change in diet lately.  Above 500 is concerning. Start fish oil 2000 mg divided daily, lower fat and carbs in diet  and return for lipids in 3 months.

## 2013-07-20 NOTE — Telephone Encounter (Signed)
Left message for patient to return my call.

## 2013-07-20 NOTE — Telephone Encounter (Signed)
Pt had health assessment at husband workplace on 07/19/13 and had fingerstick triglyceride that resulted 565; pt was fasting and has had no diet changes; pts trig on 06/08/13 was 70. Advised pt possible lab error but pt wants to know what she should do.

## 2013-07-20 NOTE — Telephone Encounter (Signed)
Patient notified as instructed by telephone. 

## 2013-08-31 ENCOUNTER — Telehealth: Payer: Self-pay

## 2013-08-31 MED ORDER — BACLOFEN 10 MG PO TABS: 10.0000 mg | ORAL_TABLET | Freq: Two times a day (BID) | ORAL | Status: DC

## 2013-08-31 MED ORDER — DICLOFENAC SODIUM 75 MG PO TBEC: 75.0000 mg | DELAYED_RELEASE_TABLET | Freq: Two times a day (BID) | ORAL | Status: DC

## 2013-08-31 NOTE — Telephone Encounter (Signed)
Patient notified as instructed by telephone. 

## 2013-08-31 NOTE — Telephone Encounter (Signed)
Sent in rx for baclofen and diclofenac. If pain not resolving in 2 weeks.. She needs OV to be seen.

## 2013-08-31 NOTE — Telephone Encounter (Signed)
Pt was in shower and when bent over pulled muscle in lower left back and center of back. Pt had similar episode about one year ago and thinks was given voltaren but pt request whatever med can be called in for pulled muscle in back so pt can get back home.pt took 3 Advil OTC but did not help pain. Pain level now 6-7. Pt planning to return home on 09/02/13. Pt said painful when trying to stand up and when sitting. [facility]. Pt request cb.

## 2013-08-31 NOTE — Telephone Encounter (Signed)
Pt left v/m; pt at [facility] and has pulled muscle in her back and request refill baclofen to pharmacy at [facility]. Left pt v/m for pt to cb.

## 2013-09-29 ENCOUNTER — Other Ambulatory Visit (INDEPENDENT_AMBULATORY_CARE_PROVIDER_SITE_OTHER): Payer: Managed Care, Other (non HMO)

## 2013-09-29 ENCOUNTER — Encounter: Payer: Self-pay | Admitting: *Deleted

## 2013-09-29 DIAGNOSIS — E781 Pure hyperglyceridemia: Secondary | ICD-10-CM

## 2013-09-29 LAB — LIPID PANEL
Cholesterol: 216 mg/dL — ABNORMAL HIGH (ref 0–200)
HDL: 82.8 mg/dL (ref >39.00)
Total CHOL/HDL Ratio: 3
Triglycerides: 43 mg/dL (ref 0.0–149.0)
VLDL: 8.6 mg/dL (ref 0.0–40.0)

## 2013-09-29 LAB — LDL CHOLESTEROL, DIRECT: Direct LDL: 121.7 mg/dL

## 2013-10-16 ENCOUNTER — Telehealth: Payer: Self-pay

## 2013-10-16 MED ORDER — PANTOPRAZOLE SODIUM 40 MG PO TBEC: 40.0000 mg | DELAYED_RELEASE_TABLET | Freq: Every day | ORAL | Status: DC

## 2013-10-16 NOTE — Telephone Encounter (Signed)
Rx sent 

## 2013-10-16 NOTE — Telephone Encounter (Signed)
Pt has changed insurance can pt wants to stop Nexium and change to protonix. Pt can get 30 day supply for less than $2.00 Nexium is not as effective.  [facility]Please advise.

## 2013-10-17 NOTE — Telephone Encounter (Signed)
Steward DroneBrenda notified Protonix prescription has been sent to [facility].

## 2013-10-17 NOTE — Telephone Encounter (Signed)
Left message for patient to return my call.

## 2013-11-09 ENCOUNTER — Encounter: Payer: Self-pay | Admitting: Family Medicine

## 2014-02-11 NOTE — Progress Notes (Signed)
HPI Patient is a 48 yo who I have seen in the past.  [facility] a [facility]  [facility]  Treated for reflux.  Has history of pancreatic divisum She has had a stress echo in the patst that was normal.   She has had a calcium score CT in past  Reported neg though I cannot find report. She presents for continued evaluation.  She still has CP  Usually more at night  Not associated with activity.  No SOB  Does say that she may give out a little more with activity.  No CP    She does say that food goes down slowly Also complains of feet and fingers always being cold     Allergies  Allergen Reactions  . Other     Walnut and [employer]  . Sulfonamide Derivatives     Current Outpatient Prescriptions  Medication Sig Dispense Refill  . aspirin 81 MG tablet Take 81 mg by mouth at bedtime.        . baclofen (LIORESAL) 10 MG tablet Take 1 tablet (10 mg total) by mouth 2 (two) times daily.  30 each  0  . Cyanocobalamin (VITAMIN B 12 PO) Take 1,000 mcg by mouth daily.      . diclofenac (VOLTAREN) 75 MG EC tablet Take 75 mg by mouth as needed.      [name]. EPINEPHrine (EPI-PEN) 0.3 mg/0.3 mL SOAJ injection Inject 0.3 mLs (0.3 mg total) into the muscle as needed.  1 Device  3  . esomeprazole (NEXIUM) 20 MG capsule Take 20 mg by mouth 2 (two) times daily before a meal.      . ibuprofen (ADVIL,MOTRIN) 200 MG tablet Take 200 mg by mouth. prn      . pantoprazole (PROTONIX) 40 MG tablet Take 1 tablet (40 mg total) by mouth daily.  30 tablet  3   No current facility-administered medications for this visit.    Past Medical History  Diagnosis Date  . Chest pain   . Congenital anomalies of pancreas   . GERD (gastroesophageal reflux disease)   . Esophageal spasm     Past Surgical History  Procedure Laterality Date  . Appendectomy    . Oophorectomy      Right  . Rotator cuff repair      Family History  Problem Relation Age of Onset  . Emphysema Paternal Grandfather   . Allergies  Mother   . Asthma Mother   . Hypertension Mother   . Heart disease Maternal Grandmother   . Lymphoma Maternal Grandfather   . Breast cancer Maternal Aunt   . Hyperlipidemia Father   . Heart disease Father [id number]  . Hypothyroidism Sister [id number]    post surgical    History   Social History  . Marital Status: Married    Spouse Name: N/A    Number of Children: 0  . Years of Education: N/A   Occupational History  . Not on file.   Social History Main Topics  . Smoking status: Never Smoker   . Smokeless tobacco: Never Used  . Alcohol Use: No  . Drug Use: No  . Sexual Activity: Not on file   Other Topics Concern  . Not on file   Social History Narrative  . No narrative on file    Review of Systems:  All systems reviewed.  They are negative to the above problem except as previously stated.  Vital Signs: BP  120/70  Pulse 82  Ht 5\' 2"  (1.575 m)  Wt 133 lb (60.328 kg)  BMI 24.32 kg/m2  Physical Exam Patient is in NAD HEENT:  Normocephalic, atraumatic. EOMI, PERRLA.  Neck: JVP is normal.  No bruits.  Lungs: clear to auscultation. No rales no wheezes.  Heart: Regular rate and rhythm. Normal S1, S2. No S3.   No significant murmurs. PMI not displaced.  Abdomen:  Supple, nontender. Normal bowel sounds. No masses. No hepatomegaly.  Extremities:   Good distal pulses throughout. No lower extremity edema.  Musculoskeletal :moving all extremities.  Neuro:   alert and oriented x3.  CN II-XII grossly intact.  EKG  [id number]  Occasional PAC Assessment and Plan: 1  CP  Atypical  Question spasm.  Exam normal.  She does have symptoms consistent with peripheral spasm  ? Coronary spasm  Unusual presentation though   ? GI  Does have history/findings of dysmotility   Would recomm CT calcium score  If low  I would give Rx of amlodpine empirically    2 . HCM  LDL in 120s  After CT will define level of control.

## 2014-02-12 ENCOUNTER — Encounter: Payer: Self-pay | Admitting: Internal Medicine

## 2014-02-12 ENCOUNTER — Ambulatory Visit (INDEPENDENT_AMBULATORY_CARE_PROVIDER_SITE_OTHER): Payer: Managed Care, Other (non HMO) | Admitting: Internal Medicine

## 2014-02-12 VITALS — BP 120/70 | HR 82 | Ht 62.0 in | Wt 133.0 lb

## 2014-02-12 DIAGNOSIS — R079 Chest pain, unspecified: Secondary | ICD-10-CM

## 2014-02-12 NOTE — Patient Instructions (Signed)
CARDIAC CALCIUM SCORE HERE AT [address]- PT IS PAYING OUT OF POCKET

## 2014-02-15 ENCOUNTER — Ambulatory Visit (INDEPENDENT_AMBULATORY_CARE_PROVIDER_SITE_OTHER)
Admission: RE | Admit: 2014-02-15 | Discharge: 2014-02-15 | Disposition: A | Discharge status: Home / Self Care | Payer: Self-pay | Source: Ambulatory Visit | Attending: Internal Medicine | Admitting: Internal Medicine

## 2014-02-15 DIAGNOSIS — R079 Chest pain, unspecified: Secondary | ICD-10-CM

## 2014-02-20 ENCOUNTER — Telehealth: Payer: Self-pay | Admitting: Internal Medicine

## 2014-02-20 NOTE — Telephone Encounter (Signed)
New message     Want CT results.---OK to leave a msg on vm.

## 2014-02-21 NOTE — Telephone Encounter (Signed)
See results note on ct scan Dr Tenny Crawross left message for pt

## 2014-02-21 NOTE — Telephone Encounter (Signed)
F/u ° ° °Previous message °

## 2014-02-21 NOTE — Telephone Encounter (Signed)
Spoke with [name], aware dr Tenny Crawross has the results and is going to review with dr Delton Seenelson

## 2014-02-26 ENCOUNTER — Telehealth: Payer: Self-pay | Admitting: Internal Medicine

## 2014-02-26 NOTE — Telephone Encounter (Signed)
New message    Dr. Tenny Crawoss called patient on Thursday. Patient was out of town. Returning call back

## 2014-02-26 NOTE — Telephone Encounter (Signed)
Will make dr ross aware. 

## 2014-03-02 IMAGING — NM NM BONE 3 PHASE
2 series · 12 of 12 positions shown · non-contrast
Comparison: None

CLINICAL DATA: Evaluate for stress fracture

NUCLEAR MEDICINE THREE PHASE BONE SCAN
TECHNIQUE: Radionuclide angiographic images, immediate static
blood pool images, and 3-hour delayed static images were obtained
after intravenous injection of radiopharmaceutical.
Radiopharmaceutical: VGHKVVK G-GARAGE HORGAS TECHNETIUM TC 99M
MEDRONATE IV KIT

[Series 1: fl flow and static · 4.75mm/px · 6 of 40 frames shown (1 of 2)]
[frame 4/40]
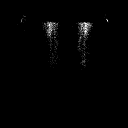
[frame 10/40  full-range]
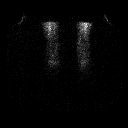
[frame 17/40  full-range]
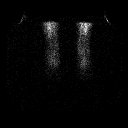
[frame 24/40  full-range]
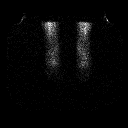
[frame 30/40  full-range]
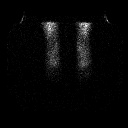
[frame 37/40  full-range]
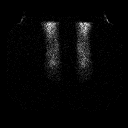

[Series 1: fl flow and static · 4.72mm/px · 6 of 40 frames shown (2 of 2)]
[frame 4/40  full-range]
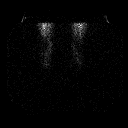
[frame 10/40  full-range]
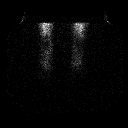
[frame 17/40  full-range]
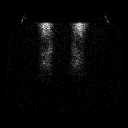
[frame 24/40  full-range]
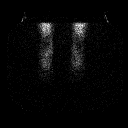
[frame 30/40  full-range]
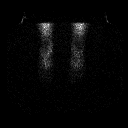
[frame 37/40  full-range]
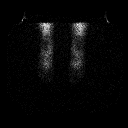

[12 of 12 positions shown; findings below may reference images not displayed]

FINDINGS: On the flow phase portion of the examination there is a
relatively symmetric radiotracer uptake to both feet.  On the blood
pool phase portion of the examination there is symmetric
radiotracer uptake to both feet.

On the delayed phase images there is symmetric radiotracer uptake
to both feet.
IMPRESSION: 1.  There are no specific features identified to suggest stress
fracture.

## 2014-03-08 ENCOUNTER — Telehealth: Payer: Self-pay | Admitting: Internal Medicine

## 2014-03-08 NOTE — Telephone Encounter (Signed)
Spoke with pt, dr Tenny Craw had left a message to call when she returned from her trip to discuss cholesterol. If call today use the home number, if call tomorrow use the cell number of [phone]. Will make dr Tenny Craw aware

## 2014-03-08 NOTE — Telephone Encounter (Signed)
Patient would like a call regarding her CT scan of her heart. Please call and advise.

## 2014-03-15 NOTE — Telephone Encounter (Signed)
Patient is returning your call. Please call [phone] she will be at this number til 4pm.

## 2014-03-15 NOTE — Telephone Encounter (Signed)
Spoke with [name], will make dr Tenny Craw aware [name] has returned her call.

## 2014-03-27 ENCOUNTER — Ambulatory Visit (INDEPENDENT_AMBULATORY_CARE_PROVIDER_SITE_OTHER): Payer: Managed Care, Other (non HMO) | Admitting: Family Medicine

## 2014-03-27 ENCOUNTER — Encounter: Payer: Self-pay | Admitting: Family Medicine

## 2014-03-27 ENCOUNTER — Telehealth: Payer: Self-pay | Admitting: Family Medicine

## 2014-03-27 VITALS — BP 126/66 | HR 89 | Temp 98.2°F | Ht 62.0 in | Wt 131.0 lb

## 2014-03-27 DIAGNOSIS — B9789 Other viral agents as the cause of diseases classified elsewhere: Principal | ICD-10-CM

## 2014-03-27 DIAGNOSIS — J069 Acute upper respiratory infection, unspecified: Secondary | ICD-10-CM

## 2014-03-27 MED ORDER — HYDROCOD POLST-CHLORPHEN POLST 10-8 MG/5ML PO LQCR: 5.0000 mL | Freq: Two times a day (BID) | ORAL | Status: DC | PRN

## 2014-03-27 NOTE — Progress Notes (Signed)
Pre visit review using our clinic review tool, if applicable. No additional management support is needed unless otherwise documented below in the visit note. 

## 2014-03-27 NOTE — Progress Notes (Signed)
Subjective:    Patient ID: [name], female    DOB: 05/27/1966, 48 y.o.   MRN: [id number]  HPI Here with a bad cold  Symptoms started over the weekend (caught it from her husband)  Burgess EstelleYesterday it got much worse  Nasal congestion ST comes and goes Cough - is non productive  Nasal drainage is beginning to turn yellow   Hacks all night-cannot sleep   No fever   Patient Active Problem List   Diagnosis Date Noted  . Abdominal pain, epigastric 06/16/2013  . Low back pain 05/29/2011  . GERD (gastroesophageal reflux disease) 02/20/2011  . Allergic rhinitis 02/20/2011  . Heart murmur 02/20/2011  . Migraine 02/20/2011  . HX of Herniated vertebral disc 02/20/2011  . PANCREAS DIVISUM 03/12/2008   Past Medical History  Diagnosis Date  . Chest pain   . Congenital anomalies of pancreas   . GERD (gastroesophageal reflux disease)   . Esophageal spasm    Past Surgical History  Procedure Laterality Date  . Appendectomy    . Oophorectomy      Right  . Rotator cuff repair     History  Substance Use Topics  . Smoking status: Never Smoker   . Smokeless tobacco: Never Used  . Alcohol Use: No   Family History  Problem Relation Age of Onset  . Emphysema Paternal Grandfather   . Allergies Mother   . Asthma Mother   . Hypertension Mother   . Heart disease Maternal Grandmother   . Lymphoma Maternal Grandfather   . Breast cancer Maternal Aunt   . Hyperlipidemia Father   . Heart disease Father [phone]  . Hypothyroidism Sister [id number]    post surgical   Allergies  Allergen Reactions  . Other     Walnut and [employer]  . Sulfonamide Derivatives    Current Outpatient Prescriptions on File Prior to Visit  Medication Sig Dispense Refill  . aspirin 81 MG tablet Take 81 mg by mouth at bedtime.        . baclofen (LIORESAL) 10 MG tablet Take 1 tablet (10 mg total) by mouth 2 (two) times daily.  30 each  0  . Cyanocobalamin (VITAMIN B 12 PO) Take 1,000 mcg by mouth daily.      .  diclofenac (VOLTAREN) 75 MG EC tablet Take 75 mg by mouth as needed.      [name]. EPINEPHrine (EPI-PEN) 0.3 mg/0.3 mL SOAJ injection Inject 0.3 mLs (0.3 mg total) into the muscle as needed.  1 Device  3  . esomeprazole (NEXIUM) 20 MG capsule Take 20 mg by mouth 2 (two) times daily before a meal.      . ibuprofen (ADVIL,MOTRIN) 200 MG tablet Take 200 mg by mouth. prn      . pantoprazole (PROTONIX) 40 MG tablet Take 1 tablet (40 mg total) by mouth daily.  30 tablet  3   No current facility-administered medications on file prior to visit.      Review of Systems Review of Systems  Constitutional: Negative for fever, appetite change, and unexpected weight change.  ENT pos for congestion and rhinorrhea and st , neg for sinus pain  Eyes: Negative for pain and visual disturbance.  Respiratory: Negative for shortness of breath.   Cardiovascular: Negative for cp or palpitations    Gastrointestinal: Negative for nausea, diarrhea and constipation.  Genitourinary: Negative for urgency and frequency.  Skin: Negative for pallor or rash   Neurological: Negative for weakness, light-headedness, numbness and headaches.  Hematological:  Negative for adenopathy. Does not bruise/bleed easily.  Psychiatric/Behavioral: Negative for dysphoric mood. The patient is not nervous/anxious.         Objective:   Physical Exam  Constitutional: She appears well-developed and well-nourished. No distress.  HENT:  Head: Normocephalic and atraumatic.  Right Ear: External ear normal.  Left Ear: External ear normal.  Mouth/Throat: Oropharynx is clear and moist.  Nares are injected and congested   No sinus tenderness   Eyes: Conjunctivae and EOM are normal. Pupils are equal, round, and reactive to light. Right eye exhibits no discharge. Left eye exhibits no discharge. No scleral icterus.  Neck: Normal range of motion. Neck supple.  Cardiovascular: Normal rate and regular rhythm.   Pulmonary/Chest: Effort normal and breath  sounds normal. No respiratory distress. She has no wheezes. She has no rales.  Harsh cough  Lymphadenopathy:    She has no cervical adenopathy.  Neurological: She is alert.  Skin: Skin is warm and dry. No rash noted.  Psychiatric: She has a normal mood and affect.          Assessment & Plan:   Problem List Items Addressed This Visit     Respiratory   Viral URI with cough - Primary     Disc symptomatic care - see instructions on AVS tussionex with caution for cough  Fluids/rest  Update if not starting to improve in a week or if worsening

## 2014-03-27 NOTE — Patient Instructions (Signed)
I think you have a viral upper respiratory infection with cough  Drink fluids and rest  advil - as needed tussionex as needed for cough- watch out for sedation  For runny nose otc zyrtec may help (or claritin or allegra)  Update if not starting to improve in a week or if worsening

## 2014-03-27 NOTE — Assessment & Plan Note (Signed)
Disc symptomatic care - see instructions on AVS tussionex with caution for cough  Fluids/rest  Update if not starting to improve in a week or if worsening

## 2014-03-27 NOTE — Telephone Encounter (Signed)
Patient Information:  Caller Name: Steward DroneBrenda  Phone: [phone]  Patient: [name], [name]  Gender: Female  DOB: 22-Dec-1965  Age: 48 Years  PCP: Kerby NoraBedsole, [facility])  Pregnant: No  Office Follow Up:  Does the office need to follow up with this patient?: No  Instructions For The Office: N/A   Symptoms  Reason For Call & Symptoms: Pt is calling to see if MD would prescribe cough medication/Hydrocodone. She had some  from a visit in 2013 which expired in 2014. Rn triaged per cold/cough and advised otc cough medication for symptoms probably post nasal drip. She started with congestion followed by a cough 2 days ago. Pt refused. States I coughed all night and otc meds do nothing for me. When I get this type of a cough I need stronger medication. Appt scheduled.  Reviewed Health History In EMR: Yes  Reviewed Medications In EMR: Yes  Reviewed Allergies In EMR: Yes  Reviewed Surgeries / Procedures: Yes  Date of Onset of Symptoms: 03/25/2014 OB / GYN:  LMP: Unknown  Guideline(s) Used:  Colds  Cough  Disposition Per Guideline:   See Today or Tomorrow in Office  Reason For Disposition Reached:   Patient wants to be seen  Advice Given:  N/A  Patient Will Follow Care Advice:  YES  Appointment Scheduled:  03/27/2014 12:30:00 Appointment Scheduled Provider:  Roxy Mannsower, [facility](Family Practice)

## 2014-04-03 NOTE — Telephone Encounter (Signed)
Dr Tenny Crawross has left a detailed message for the patient.

## 2014-06-12 ENCOUNTER — Telehealth: Payer: Self-pay | Admitting: Family Medicine

## 2014-06-12 ENCOUNTER — Other Ambulatory Visit: Payer: Managed Care, Other (non HMO)

## 2014-06-12 DIAGNOSIS — Z13220 Encounter for screening for lipoid disorders: Secondary | ICD-10-CM

## 2014-06-12 NOTE — Telephone Encounter (Signed)
Message copied by Excell Seltzer on Tue Jun 12, 2014  4:28 PM ------      Message from: [name]      Created: Fri Jun 08, 2014  9:31 AM      Regarding: Lab orders for 9.2.15       Patient is scheduled for CPX labs, please order future labs, Thanks , [name]       ------

## 2014-06-12 NOTE — Telephone Encounter (Signed)
Pt left vm requesting call back from triage nurse or lab regarding questions she has about her labwork tomorrow.  Left vm for pt to return call.

## 2014-06-13 ENCOUNTER — Other Ambulatory Visit (INDEPENDENT_AMBULATORY_CARE_PROVIDER_SITE_OTHER): Payer: Managed Care, Other (non HMO)

## 2014-06-13 DIAGNOSIS — Z13220 Encounter for screening for lipoid disorders: Secondary | ICD-10-CM

## 2014-06-13 LAB — LIPID PANEL
Cholesterol: 224 mg/dL — ABNORMAL HIGH (ref 0–200)
HDL: 85.1 mg/dL (ref >39.00)
LDL Cholesterol: 129 mg/dL — ABNORMAL HIGH (ref 0–99)
NonHDL: 138.9
Total CHOL/HDL Ratio: 3
Triglycerides: 52 mg/dL (ref 0.0–149.0)
VLDL: 10.4 mg/dL (ref 0.0–40.0)

## 2014-06-13 LAB — COMPREHENSIVE METABOLIC PANEL
ALT: 18 U/L (ref 0–35)
AST: 19 U/L (ref 0–37)
Albumin: 4.3 g/dL (ref 3.5–5.2)
Alkaline Phosphatase: 60 U/L (ref 39–117)
BUN: 10 mg/dL (ref 6–23)
CO2: 30 mEq/L (ref 19–32)
Calcium: 9.2 mg/dL (ref 8.4–10.5)
Chloride: 104 mEq/L (ref 96–112)
Creatinine, Ser: 0.6 mg/dL (ref 0.4–1.2)
GFR: 112.92 mL/min (ref >60.00)
Glucose, Bld: 98 mg/dL (ref 70–99)
Potassium: 4 mEq/L (ref 3.5–5.1)
Sodium: 140 mEq/L (ref 135–145)
Total Bilirubin: 0.6 mg/dL (ref 0.2–1.2)
Total Protein: 7.1 g/dL (ref 6.0–8.3)

## 2014-06-19 ENCOUNTER — Ambulatory Visit (INDEPENDENT_AMBULATORY_CARE_PROVIDER_SITE_OTHER): Payer: Managed Care, Other (non HMO) | Admitting: Family Medicine

## 2014-06-19 ENCOUNTER — Encounter: Payer: Self-pay | Admitting: Family Medicine

## 2014-06-19 VITALS — BP 96/52 | HR 77 | Temp 98.4°F | Ht 62.25 in | Wt 133.8 lb

## 2014-06-19 DIAGNOSIS — R011 Cardiac murmur, unspecified: Secondary | ICD-10-CM

## 2014-06-19 DIAGNOSIS — Z0289 Encounter for other administrative examinations: Secondary | ICD-10-CM

## 2014-06-19 DIAGNOSIS — Z23 Encounter for immunization: Secondary | ICD-10-CM

## 2014-06-19 DIAGNOSIS — K219 Gastro-esophageal reflux disease without esophagitis: Secondary | ICD-10-CM

## 2014-06-19 MED ORDER — EPINEPHRINE 0.15 MG/0.15ML IJ SOAJ: INTRAMUSCULAR | Status: DC

## 2014-06-19 NOTE — Assessment & Plan Note (Signed)
Stable on nexium

## 2014-06-19 NOTE — Progress Notes (Signed)
Pre visit review using our clinic review tool, if applicable. No additional management support is needed unless otherwise documented below in the visit note. 

## 2014-06-19 NOTE — Patient Instructions (Signed)
Work on regular exercise and low cholesterol diet.  Fat and Cholesterol Control Diet Fat and cholesterol levels in your blood and organs are influenced by your diet. High levels of fat and cholesterol may lead to diseases of the heart, small and large blood vessels, gallbladder, liver, and pancreas. CONTROLLING FAT AND CHOLESTEROL WITH DIET Although exercise and lifestyle factors are important, your diet is key. That is because certain foods are known to raise cholesterol and others to lower it. The goal is to balance foods for their effect on cholesterol and more importantly, to replace saturated and trans fat with other types of fat, such as monounsaturated fat, polyunsaturated fat, and omega-3 fatty acids. On average, a person should consume no more than 15 to 17 g of saturated fat daily. Saturated and trans fats are considered "bad" fats, and they will raise LDL cholesterol. Saturated fats are primarily found in animal products such as meats, butter, and cream. However, that does not mean you need to give up all your favorite foods. Today, there are good tasting, low-fat, low-cholesterol substitutes for most of the things you like to eat. Choose low-fat or nonfat alternatives. Choose round or loin cuts of red meat. These types of cuts are lowest in fat and cholesterol. Chicken (without the skin), fish, veal, and ground [place] breast are great choices. Eliminate fatty meats, such as hot dogs and salami. Even shellfish have little or no saturated fat. Have a 3 oz (85 g) portion when you eat lean meat, poultry, or fish. Trans fats are also called "partially hydrogenated oils." They are oils that have been scientifically manipulated so that they are solid at room temperature resulting in a longer shelf life and improved taste and texture of foods in which they are added. Trans fats are found in stick margarine, some tub margarines, cookies, crackers, and baked goods.  When baking and cooking, oils are a  great substitute for butter. The monounsaturated oils are especially beneficial since it is believed they lower LDL and raise HDL. The oils you should avoid entirely are saturated tropical oils, such as coconut and palm.  Remember to eat a lot from food groups that are naturally free of saturated and trans fat, including fish, fruit, vegetables, beans, grains (barley, rice, couscous, bulgur wheat), and pasta (without cream sauces).  IDENTIFYING FOODS THAT LOWER FAT AND CHOLESTEROL  Soluble fiber may lower your cholesterol. This type of fiber is found in fruits such as apples, vegetables such as broccoli, potatoes, and carrots, legumes such as beans, peas, and lentils, and grains such as barley. Foods fortified with plant sterols (phytosterol) may also lower cholesterol. You should eat at least 2 g per day of these foods for a cholesterol lowering effect.  Read package labels to identify low-saturated fats, trans fat free, and low-fat foods at the supermarket. Select cheeses that have only 2 to 3 g saturated fat per ounce. Use a heart-healthy tub margarine that is free of trans fats or partially hydrogenated oil. When buying baked goods (cookies, crackers), avoid partially hydrogenated oils. Breads and muffins should be made from whole grains (whole-wheat or whole oat flour, instead of "flour" or "enriched flour"). Buy non-creamy canned soups with reduced salt and no added fats.  FOOD PREPARATION TECHNIQUES  Never deep-fry. If you must fry, either stir-fry, which uses very little fat, or use non-stick cooking sprays. When possible, broil, bake, or roast meats, and steam vegetables. Instead of putting butter or margarine on vegetables, use lemon and herbs, applesauce,  and cinnamon (for squash and sweet potatoes). Use nonfat yogurt, salsa, and low-fat dressings for salads.  LOW-SATURATED FAT / LOW-FAT FOOD SUBSTITUTES Meats / Saturated Fat (g)  Avoid: Steak, marbled (3 oz/85 g) / 11 g  Choose: Steak, lean  (3 oz/85 g) / 4 g  Avoid: Hamburger (3 oz/85 g) / 7 g  Choose: Hamburger, lean (3 oz/85 g) / 5 g  Avoid: Ham (3 oz/85 g) / 6 g  Choose: Ham, lean cut (3 oz/85 g) / 2.4 g  Avoid: Chicken, with skin, dark meat (3 oz/85 g) / 4 g  Choose: Chicken, skin removed, dark meat (3 oz/85 g) / 2 g  Avoid: Chicken, with skin, light meat (3 oz/85 g) / 2.5 g  Choose: Chicken, skin removed, light meat (3 oz/85 g) / 1 g Dairy / Saturated Fat (g)  Avoid: Whole milk (1 cup) / 5 g  Choose: Low-fat milk, 2% (1 cup) / 3 g  Choose: Low-fat milk, 1% (1 cup) / 1.5 g  Choose: Skim milk (1 cup) / 0.3 g  Avoid: Hard cheese (1 oz/28 g) / 6 g  Choose: Skim milk cheese (1 oz/28 g) / 2 to 3 g  Avoid: Cottage cheese, 4% fat (1 cup) / 6.5 g  Choose: Low-fat cottage cheese, 1% fat (1 cup) / 1.5 g  Avoid: Ice cream (1 cup) / 9 g  Choose: Sherbet (1 cup) / 2.5 g  Choose: Nonfat frozen yogurt (1 cup) / 0.3 g  Choose: Frozen fruit bar / trace  Avoid: Whipped cream (1 tbs) / 3.5 g  Choose: Nondairy whipped topping (1 tbs) / 1 g Condiments / Saturated Fat (g)  Avoid: Mayonnaise (1 tbs) / 2 g  Choose: Low-fat mayonnaise (1 tbs) / 1 g  Avoid: Butter (1 tbs) / 7 g  Choose: Extra light margarine (1 tbs) / 1 g  Avoid: Coconut oil (1 tbs) / 11.8 g  Choose: Olive oil (1 tbs) / 1.8 g  Choose: Corn oil (1 tbs) / 1.7 g  Choose: Safflower oil (1 tbs) / 1.2 g  Choose: Sunflower oil (1 tbs) / 1.4 g  Choose: Soybean oil (1 tbs) / 2.4 g  Choose: Canola oil (1 tbs) / 1 g Document Released: 09/28/2005 Document Revised: 01/23/2013 Document Reviewed: 12/27/2013 ExitCare Patient Information 2015 [place], [place]. This information is not intended to replace advice given to you by your health care provider. Make sure you discuss any questions you have with your health care provider.

## 2014-06-19 NOTE — Addendum Note (Signed)
Addended by: Damita Lack on: 06/19/2014 12:10 PM   Modules accepted: Orders

## 2014-06-19 NOTE — Progress Notes (Signed)
HPI  The patient is here for annual Chronic health maintenance. She sees GYN for CPX . Has exam next week with Dr. Ernestina Penna.  Atypical Chest pain followed by Dr. Tenny Craw Cardiologist. Had cardiac CT.  No significant findings.  Has heart murmur.. No significant issues on last ECHO in 2009-2001  Followed by Dr. Lanell Matar, GI for esophageal spasm ( on baclofen).   She is on nexium 40 mg daily.. Had ENDO in 09/2012.   Reviewed labs in detail with pt. LDL at goal < 130. Lab Results  Component Value Date   CHOL 224* 06/13/2014   HDL 85.10 06/13/2014   LDLCALC 129* 06/13/2014   LDLDIRECT 121.7 09/29/2013   TRIG 52.0 06/13/2014   CHOLHDL 3 06/13/2014  Minimal exercise.  Healthy diet.    She has some achiness in her joints 1-2 times a month. Moderate wrist ankle and upper neck pain. No redness, no joint welling. No RA faimly history. Review of Systems  Constitutional: Negative for fever and fatigue.  HENT: Negative for ear pain.  Respiratory: Negative for chest tightness and shortness of breath.  Cardiovascular: Negative for chest pain, palpitations and leg swelling.  Gastrointestinal: Negative for abdominal pain.  Genitourinary: Negative for dysuria.  Objective:   Physical Exam  Constitutional: Vital signs are normal. She appears well-developed and well-nourished. She is cooperative. Non-toxic appearance. She does not appear ill. No distress.  HENT:  Head: Normocephalic.  Right Ear: Hearing, tympanic membrane, external ear and ear canal normal.  Left Ear: Hearing, tympanic membrane, external ear and ear canal normal.  Nose: Nose normal.  Eyes: Conjunctivae, EOM and lids are normal. Pupils are equal, round, and reactive to light. Lids are everted and swept, no foreign bodies found.  Neck: Trachea normal and normal range of motion. Neck supple. Carotid bruit is not present. No mass and no thyromegaly present.  Cardiovascular: Normal rate, regular rhythm, S1 normal, S2 normal, normal heart sounds and  intact distal pulses. Exam reveals no gallop.  No murmur heard.  Pulmonary/Chest: Effort normal and breath sounds normal. No respiratory distress. She has no wheezes. She has no rhonchi. She has no rales.  Abdominal: Soft. Normal appearance and bowel sounds are normal. She exhibits no distension, no fluid wave, no abdominal bruit and no mass. There is no hepatosplenomegaly. There is no tenderness. There is no rebound, no guarding and no CVA tenderness. No hernia.  Lymphadenopathy:  She has no cervical adenopathy.  She has no axillary adenopathy.  Neurological: She is alert. She has normal strength. No cranial nerve deficit or sensory deficit.  Skin: Skin is warm, dry and intact. No rash noted.  Psychiatric: Her speech is normal and behavior is normal. Judgment normal. Her mood appears not anxious. Cognition and memory are normal. She does not exhibit a depressed mood.  Assessment & Plan:   The patient's preventative maintenance and recommended screening tests for an annual wellness exam were reviewed in full today.  Brought up to date unless services declined.  Counselled on the importance of diet, exercise, and its role in overall health and mortality.  The patient's FH and SH was reviewed, including their home life, tobacco status, and drug and alcohol status.   Vaccines: uptodate  Td, due flu PAP/DVE: See Gyn, nml exam  Mammo:10/2013 Colon: no family history of early colon cancer   FORMS completed for wellness program at work.

## 2014-10-03 ENCOUNTER — Telehealth: Payer: Self-pay

## 2014-10-03 MED ORDER — EPINEPHRINE 0.3 MG/0.3ML IJ SOAJ: 0.3000 mg | Freq: Once | INTRAMUSCULAR | Status: DC

## 2014-10-03 NOTE — Telephone Encounter (Signed)
[name] from [facility] [address] left v/m; epinephrine 0.15 mg which is pediatric dose was prescribed 06/2014. [name] request cb to confirm pt needs pediatric dose or should pt get epipen 0.3 mg which is adult dose.Please advise.

## 2014-10-03 NOTE — Telephone Encounter (Signed)
Agreed -

## 2014-10-03 NOTE — Telephone Encounter (Signed)
Per Medication History [name] has always gotten the Epipen 0.3 mg.  New prescription sent to [facility] [address].  Christy at [facility] also notified.

## 2014-10-15 ENCOUNTER — Other Ambulatory Visit: Payer: Self-pay

## 2014-10-15 NOTE — Telephone Encounter (Signed)
Pt last filled diclofenac in 2014 and pt twisted back over weekend and pt had diclofenac at home but is out of med now; back is better but pt request refill diclofenac to [facility]. Pt last seen 09/*08/15.Please advise.

## 2014-10-16 MED ORDER — DICLOFENAC SODIUM 75 MG PO TBEC: 75.0000 mg | DELAYED_RELEASE_TABLET | ORAL | Status: DC | PRN

## 2014-12-07 ENCOUNTER — Encounter: Payer: Self-pay | Admitting: Family Medicine

## 2014-12-07 ENCOUNTER — Ambulatory Visit (INDEPENDENT_AMBULATORY_CARE_PROVIDER_SITE_OTHER): Payer: Managed Care, Other (non HMO) | Admitting: Family Medicine

## 2014-12-07 VITALS — BP 114/62 | HR 73 | Temp 98.4°F | Ht 62.25 in | Wt 131.0 lb

## 2014-12-07 DIAGNOSIS — H6091 Unspecified otitis externa, right ear: Secondary | ICD-10-CM

## 2014-12-07 MED ORDER — NEOMYCIN-POLYMYXIN-HC 3.5-10000-1 OT SOLN: 4.0000 [drp] | Freq: Four times a day (QID) | OTIC | Status: AC

## 2014-12-07 NOTE — Patient Instructions (Signed)
Complete course of ear drops for external ear infection.  Otitis Externa Otitis externa is a bacterial or fungal infection of the outer ear canal. This is the area from the eardrum to the outside of the ear. Otitis externa is sometimes called "swimmer's ear." CAUSES  Possible causes of infection include:  Swimming in dirty water.  Moisture remaining in the ear after swimming or bathing.  Mild injury (trauma) to the ear.  Objects stuck in the ear (foreign body).  Cuts or scrapes (abrasions) on the outside of the ear. SIGNS AND SYMPTOMS  The first symptom of infection is often itching in the ear canal. Later signs and symptoms may include swelling and redness of the ear canal, ear pain, and yellowish-white fluid (pus) coming from the ear. The ear pain may be worse when pulling on the earlobe. DIAGNOSIS  Your health care provider will perform a physical exam. A sample of fluid may be taken from the ear and examined for bacteria or fungi. TREATMENT  Antibiotic ear drops are often given for 10 to 14 days. Treatment may also include pain medicine or corticosteroids to reduce itching and swelling. HOME CARE INSTRUCTIONS   Apply antibiotic ear drops to the ear canal as prescribed by your health care provider.  Take medicines only as directed by your health care provider.  If you have diabetes, follow any additional treatment instructions from your health care provider.  Keep all follow-up visits as directed by your health care provider. PREVENTION   Keep your ear dry. Use the corner of a towel to absorb water out of the ear canal after swimming or bathing.  Avoid scratching or putting objects inside your ear. This can damage the ear canal or remove the protective wax that lines the canal. This makes it easier for bacteria and fungi to grow.  Avoid swimming in lakes, polluted water, or poorly chlorinated pools.  You may use ear drops made of rubbing alcohol and vinegar after swimming.  Combine equal parts of white vinegar and alcohol in a bottle. Put 3 or 4 drops into each ear after swimming. SEEK MEDICAL CARE IF:   You have a fever.  Your ear is still red, swollen, painful, or draining pus after 3 days.  Your redness, swelling, or pain gets worse.  You have a severe headache.  You have redness, swelling, pain, or tenderness in the area behind your ear. MAKE SURE YOU:   Understand these instructions.  Will watch your condition.  Will get help right away if you are not doing well or get worse. Document Released: 09/28/2005 Document Revised: 02/12/2014 Document Reviewed: 10/15/2011 [facility] Patient Information 2015 [place], [place]. This information is not intended to replace advice given to you by your health care provider. Make sure you discuss any questions you have with your health care provider.

## 2014-12-07 NOTE — Progress Notes (Signed)
   Subjective:    Patient ID: [name], female    DOB: [birth year], 49 y.o.   MRN: [id number]  HPI   49 year old female with 7 days of ear congestion,  Right ear pain started in last 24 hours.  Pain in forehead and behind eyes  X 1 week.  No ear discharge.  No fever.  No SOB, no wheeze. No nasal congestion, small amount of blood from nose few days ago.  Using advil for symptoms.  Sick contacts: none   Hx: allergic rhinitis, hx swimmers ear.   Review of Systems  Constitutional: Negative for fever and fatigue.  HENT: Negative for ear pain.   Eyes: Negative for pain.  Respiratory: Negative for chest tightness and shortness of breath.   Cardiovascular: Negative for chest pain, palpitations and leg swelling.  Gastrointestinal: Negative for abdominal pain.  Genitourinary: Negative for dysuria.       Objective:   Physical Exam  Constitutional: Vital signs are normal. She appears well-developed and well-nourished. She is cooperative.  Non-toxic appearance. She does not appear ill. No distress.  HENT:  Head: Normocephalic.  Right Ear: Hearing, tympanic membrane and ear canal normal. There is drainage, swelling and tenderness. Tympanic membrane is not injected, not scarred, not erythematous, not retracted and not bulging. No middle ear effusion.  Left Ear: Hearing, tympanic membrane, external ear and ear canal normal. Tympanic membrane is not erythematous, not retracted and not bulging.  No middle ear effusion.  Nose: No mucosal edema or rhinorrhea. Right sinus exhibits no maxillary sinus tenderness and no frontal sinus tenderness. Left sinus exhibits no maxillary sinus tenderness and no frontal sinus tenderness.  Mouth/Throat: Uvula is midline, oropharynx is clear and moist and mucous membranes are normal.  Eyes: Conjunctivae, EOM and lids are normal. Pupils are equal, round, and reactive to light. Lids are everted and swept, no foreign bodies found.  Neck: Trachea normal and  normal range of motion. Neck supple. Carotid bruit is not present. No thyroid mass and no thyromegaly present.  Cardiovascular: Normal rate, regular rhythm, S1 normal, S2 normal, normal heart sounds, intact distal pulses and normal pulses.  Exam reveals no gallop and no friction rub.   No murmur heard. Pulmonary/Chest: Effort normal and breath sounds normal. No tachypnea. No respiratory distress. She has no decreased breath sounds. She has no wheezes. She has no rhonchi. She has no rales.  Abdominal: Soft. Normal appearance and bowel sounds are normal. There is no tenderness.  Neurological: She is alert.  Skin: Skin is warm, dry and intact. No rash noted.  Psychiatric: Her speech is normal and behavior is normal. Judgment and thought content normal. Her mood appears not anxious. Cognition and memory are normal. She does not exhibit a depressed mood.          Assessment & Plan:

## 2014-12-07 NOTE — Progress Notes (Signed)
Pre visit review using our clinic review tool, if applicable. No additional management support is needed unless otherwise documented below in the visit note. 

## 2014-12-07 NOTE — Assessment & Plan Note (Signed)
Info on prevention given. Complete course of otic antibiotics.

## 2015-06-14 ENCOUNTER — Encounter: Payer: Self-pay | Admitting: Primary Care

## 2015-06-14 ENCOUNTER — Ambulatory Visit (INDEPENDENT_AMBULATORY_CARE_PROVIDER_SITE_OTHER): Payer: Managed Care, Other (non HMO) | Admitting: Primary Care

## 2015-06-14 VITALS — BP 112/64 | HR 77 | Temp 98.0°F | Ht 62.25 in | Wt 133.1 lb

## 2015-06-14 DIAGNOSIS — H6693 Otitis media, unspecified, bilateral: Secondary | ICD-10-CM | POA: Diagnosis not present

## 2015-06-14 MED ORDER — CIPROFLOXACIN-DEXAMETHASONE 0.3-0.1 % OT SUSP: 4.0000 [drp] | Freq: Two times a day (BID) | OTIC | Status: DC

## 2015-06-14 NOTE — Progress Notes (Signed)
Subjective:    Patient ID: [name], female    DOB: 26-Jan-1966, 49 y.o.   MRN: [id number]  HPI  Ms. Bhullar is a 49 year old female who presents today with a chief complaint of ear fullness. Her fullness is located to the right ear with itching and has been present since Sunday this past weekend. She also reports intermittent pain that has been present since yesterday which is now affecting the left ear. Denies fevers, sore throat, has not been to the beach or pool. She has a history of swimmers ear. She started taking Zyrtec Wednesday this week with a reduction in itching.   Review of Systems  Constitutional: Negative for fever and chills.  HENT: Positive for ear pain. Negative for congestion, postnasal drip, sinus pressure and sore throat.   Respiratory: Negative for shortness of breath.   Cardiovascular: Negative for chest pain.  Musculoskeletal: Negative for myalgias.       Past Medical History  Diagnosis Date  . Chest pain   . Congenital anomalies of pancreas   . GERD (gastroesophageal reflux disease)   . Esophageal spasm     Social History   Social History  . Marital Status: Married    Spouse Name: N/A  . Number of Children: 0  . Years of Education: N/A   Occupational History  . Not on file.   Social History Main Topics  . Smoking status: Never Smoker   . Smokeless tobacco: Never Used  . Alcohol Use: No  . Drug Use: No  . Sexual Activity: Not on file   Other Topics Concern  . Not on file   Social History Narrative    Past Surgical History  Procedure Laterality Date  . Appendectomy    . Oophorectomy      Right  . Rotator cuff repair      Family History  Problem Relation Age of Onset  . Emphysema Paternal Grandfather   . Allergies Mother   . Asthma Mother   . Hypertension Mother   . Heart disease Maternal Grandmother   . Lymphoma Maternal Grandfather   . Breast cancer Maternal Aunt   . Hyperlipidemia Father   . Heart disease Father 65    . Hypothyroidism Sister 44    post surgical    Allergies  Allergen Reactions  . Other     Walnut and pecans  . Sulfonamide Derivatives     Current Outpatient Prescriptions on File Prior to Visit  Medication Sig Dispense Refill  . aspirin 81 MG tablet Take 81 mg by mouth at bedtime.      . baclofen (LIORESAL) 10 MG tablet Take 1 tablet (10 mg total) by mouth 2 (two) times daily. (Patient taking differently: Take 10 mg by mouth daily. ) 30 each 0  . Cyanocobalamin (VITAMIN B 12 PO) Take 1,000 mcg by mouth daily.    . diclofenac (VOLTAREN) 75 MG EC tablet Take 1 tablet (75 mg total) by mouth as needed. 30 tablet 0  . EPINEPHrine 0.3 mg/0.3 mL IJ SOAJ injection Inject 0.3 mLs (0.3 mg total) into the muscle once. 1 Device 0  . esomeprazole (NEXIUM) 20 MG capsule Take 20 mg by mouth daily.     [name] ibuprofen (ADVIL,MOTRIN) 200 MG tablet Take 200 mg by mouth. prn     No current facility-administered medications on file prior to visit.    BP 112/64 mmHg  Pulse 77  Temp(Src) 98 F (36.7 C) (Oral)  Ht 5' 2.25" (  1.581 m)  Wt 133 lb 1.9 oz (60.383 kg)  BMI 24.16 kg/m2  SpO2 98%    Objective:   Physical Exam  Constitutional: She appears well-nourished.  HENT:  Right Ear: There is swelling and tenderness.  Left Ear: There is swelling and tenderness.  Nose: Nose normal.  Mouth/Throat: Oropharynx is clear and moist.  Bilateral canals with swelling and erythema. TM cloudy without erythema.   Eyes: Conjunctivae are normal. Pupils are equal, round, and reactive to light.  Neck: Neck supple.  Cardiovascular: Normal rate and regular rhythm.   Pulmonary/Chest: Effort normal and breath sounds normal.  Lymphadenopathy:    She has no cervical adenopathy.  Skin: Skin is warm and dry.          Assessment & Plan:  Otitis externa:  Ear fullness with itching since Sunday last week, now with pain to bilateral ears. Canals with erythema and swelling, tender upon exam. TM's dull without  signs of infection. RX for ciprodex BID x 7 days. She is to follow up if no improvement.

## 2015-06-14 NOTE — Patient Instructions (Signed)
Start Ciprodex ear drops. Instill 4 drops to both ears twice daily for 7 days.  Please call me if no improvement in symptoms. Do not insert a Q-Tip into your ears as this can introduce more infection and cause irritation.   It was a pleasure meeting you!  Otitis Externa Otitis externa is a bacterial or fungal infection of the outer ear canal. This is the area from the eardrum to the outside of the ear. Otitis externa is sometimes called "swimmer's ear." CAUSES  Possible causes of infection include:  Swimming in dirty water.  Moisture remaining in the ear after swimming or bathing.  Mild injury (trauma) to the ear.  Objects stuck in the ear (foreign body).  Cuts or scrapes (abrasions) on the outside of the ear. SIGNS AND SYMPTOMS  The first symptom of infection is often itching in the ear canal. Later signs and symptoms may include swelling and redness of the ear canal, ear pain, and yellowish-white fluid (pus) coming from the ear. The ear pain may be worse when pulling on the earlobe. DIAGNOSIS  Your health care provider will perform a physical exam. A sample of fluid may be taken from the ear and examined for bacteria or fungi. TREATMENT  Antibiotic ear drops are often given for 10 to 14 days. Treatment may also include pain medicine or corticosteroids to reduce itching and swelling. HOME CARE INSTRUCTIONS   Apply antibiotic ear drops to the ear canal as prescribed by your health care provider.  Take medicines only as directed by your health care provider.  If you have diabetes, follow any additional treatment instructions from your health care provider.  Keep all follow-up visits as directed by your health care provider. PREVENTION   Keep your ear dry. Use the corner of a towel to absorb water out of the ear canal after swimming or bathing.  Avoid scratching or putting objects inside your ear. This can damage the ear canal or remove the protective wax that lines the canal.  This makes it easier for bacteria and fungi to grow.  Avoid swimming in lakes, polluted water, or poorly chlorinated pools.  You may use ear drops made of rubbing alcohol and vinegar after swimming. Combine equal parts of white vinegar and alcohol in a bottle. Put 3 or 4 drops into each ear after swimming. SEEK MEDICAL CARE IF:   You have a fever.  Your ear is still red, swollen, painful, or draining pus after 3 days.  Your redness, swelling, or pain gets worse.  You have a severe headache.  You have redness, swelling, pain, or tenderness in the area behind your ear. MAKE SURE YOU:   Understand these instructions.  Will watch your condition.  Will get help right away if you are not doing well or get worse. Document Released: 09/28/2005 Document Revised: 02/12/2014 Document Reviewed: 10/15/2011 [facility] Patient Information 2015 [place], [place]. This information is not intended to replace advice given to you by your health care provider. Make sure you discuss any questions you have with your health care provider.

## 2015-06-14 NOTE — Progress Notes (Signed)
Pre visit review using our clinic review tool, if applicable. No additional management support is needed unless otherwise documented below in the visit note. 

## 2015-07-08 ENCOUNTER — Telehealth: Payer: Self-pay | Admitting: Family Medicine

## 2015-07-08 DIAGNOSIS — E221 Hyperprolactinemia: Secondary | ICD-10-CM

## 2015-07-08 NOTE — Telephone Encounter (Signed)
Pt called and her OB/GYN Dr. Algie Coffer @ [facility] would like for her to have her prolactin levels checked.  Can this be completed at her visit on 07/11/15 with her CPE lab draw?  Please give her a call back either way.  Best number to call her this week is [phone] (her mother's home number) / lt

## 2015-07-09 ENCOUNTER — Telehealth: Payer: Self-pay | Admitting: Family Medicine

## 2015-07-09 DIAGNOSIS — Z13220 Encounter for screening for lipoid disorders: Secondary | ICD-10-CM

## 2015-07-09 DIAGNOSIS — E221 Hyperprolactinemia: Secondary | ICD-10-CM | POA: Insufficient documentation

## 2015-07-09 DIAGNOSIS — Z8639 Personal history of other endocrine, nutritional and metabolic disease: Secondary | ICD-10-CM | POA: Insufficient documentation

## 2015-07-09 NOTE — Telephone Encounter (Signed)
Called patient's mother's telephone number and was advised that she is not there and to call her at home. Left message on home number to call back.

## 2015-07-09 NOTE — Telephone Encounter (Signed)
Will add prolactin, but what is the reason for the test?

## 2015-07-09 NOTE — Telephone Encounter (Signed)
-----   Message from [name] sent at 07/01/2015  3:01 PM EDT ----- Regarding: Lab orders for Thursday, 9.29.16 Patient is scheduled for CPX labs, please order future labs, Thanks , Camelia Eng

## 2015-07-09 NOTE — Telephone Encounter (Signed)
Spoke with Steward Drone.  She states she has had an elevated prolactin in the past and they did a MRI to make sure she didn't have a tumor, so now they just monitor her Prolactin level yearly.

## 2015-07-11 ENCOUNTER — Other Ambulatory Visit: Payer: Managed Care, Other (non HMO)

## 2015-07-11 ENCOUNTER — Other Ambulatory Visit (INDEPENDENT_AMBULATORY_CARE_PROVIDER_SITE_OTHER): Payer: Managed Care, Other (non HMO)

## 2015-07-11 DIAGNOSIS — E221 Hyperprolactinemia: Secondary | ICD-10-CM

## 2015-07-11 DIAGNOSIS — Z13220 Encounter for screening for lipoid disorders: Secondary | ICD-10-CM | POA: Diagnosis not present

## 2015-07-11 LAB — COMPREHENSIVE METABOLIC PANEL
ALT: 14 U/L (ref 0–35)
AST: 18 U/L (ref 0–37)
Albumin: 4.4 g/dL (ref 3.5–5.2)
Alkaline Phosphatase: 62 U/L (ref 39–117)
BUN: 15 mg/dL (ref 6–23)
CO2: 31 mEq/L (ref 19–32)
Calcium: 9.5 mg/dL (ref 8.4–10.5)
Chloride: 105 mEq/L (ref 96–112)
Creatinine, Ser: 0.59 mg/dL (ref 0.40–1.20)
GFR: 114.63 mL/min (ref >60.00)
Glucose, Bld: 97 mg/dL (ref 70–99)
Potassium: 4.2 mEq/L (ref 3.5–5.1)
Sodium: 142 mEq/L (ref 135–145)
Total Bilirubin: 0.3 mg/dL (ref 0.2–1.2)
Total Protein: 7.1 g/dL (ref 6.0–8.3)

## 2015-07-11 LAB — LIPID PANEL
Cholesterol: 219 mg/dL — ABNORMAL HIGH (ref 0–200)
HDL: 84.3 mg/dL (ref >39.00)
LDL Cholesterol: 127 mg/dL — ABNORMAL HIGH (ref 0–99)
NonHDL: 135.14
Total CHOL/HDL Ratio: 3
Triglycerides: 41 mg/dL (ref 0.0–149.0)
VLDL: 8.2 mg/dL (ref 0.0–40.0)

## 2015-07-12 LAB — PROLACTIN: Prolactin: 12.8 ng/mL

## 2015-07-18 ENCOUNTER — Encounter (INDEPENDENT_AMBULATORY_CARE_PROVIDER_SITE_OTHER): Payer: Self-pay

## 2015-07-18 ENCOUNTER — Ambulatory Visit (INDEPENDENT_AMBULATORY_CARE_PROVIDER_SITE_OTHER): Payer: Managed Care, Other (non HMO) | Admitting: Family Medicine

## 2015-07-18 ENCOUNTER — Encounter: Payer: Self-pay | Admitting: Family Medicine

## 2015-07-18 VITALS — BP 110/60 | HR 85 | Temp 98.4°F | Ht 62.0 in | Wt 134.5 lb

## 2015-07-18 DIAGNOSIS — Z23 Encounter for immunization: Secondary | ICD-10-CM

## 2015-07-18 DIAGNOSIS — E221 Hyperprolactinemia: Secondary | ICD-10-CM

## 2015-07-18 DIAGNOSIS — Z0000 Encounter for general adult medical examination without abnormal findings: Secondary | ICD-10-CM | POA: Diagnosis not present

## 2015-07-18 NOTE — Assessment & Plan Note (Signed)
Stable on labs

## 2015-07-18 NOTE — Patient Instructions (Addendum)
Work on [employer] and regular exercise. Decrease bacon!! Can try compression hose 15-30 mmHg to help prevent more varicose veins.

## 2015-07-18 NOTE — Progress Notes (Signed)
Pre visit review using our clinic review tool, if applicable. No additional management support is needed unless otherwise documented below in the visit note. 

## 2015-07-18 NOTE — Progress Notes (Signed)
Subjective:    Patient ID: [name], female    DOB: [birth year], 49 y.o.   MRN: [id number]  HPI    49 year old female presents for annual exam.  Doing well overall. Diet:  Moderate Exercise: none  BP Readings from Last 3 Encounters:  07/18/15 110/60  06/14/15 112/64  12/07/14 114/62    Reviewed labs in detail with pt. Lab Results  Component Value Date   CHOL 219* 07/11/2015   HDL 84.30 07/11/2015   LDLCALC 127* 07/11/2015   LDLDIRECT 121.7 09/29/2013   TRIG 41.0 07/11/2015   CHOLHDL 3 07/11/2015   HX of hyperprolactinemia: prolactin level  12.8 in nml range.  Wt Readings from Last 3 Encounters:  07/18/15 134 lb 8 oz (61.009 kg)  06/14/15 133 lb 1.9 oz (60.383 kg)  12/07/14 131 lb (59.421 kg)   Body mass index is 24.59 kg/(m^2).     Review of Systems  Constitutional: Negative for fever and fatigue.  HENT: Negative for congestion and ear pain.   Eyes: Negative for pain.  Respiratory: Negative for cough, chest tightness and shortness of breath.   Cardiovascular: Negative for chest pain, palpitations and leg swelling.  Gastrointestinal: Negative for abdominal pain.  Genitourinary: Negative for dysuria and vaginal bleeding.  Musculoskeletal: Positive for back pain.  Neurological: Negative for syncope, light-headedness and headaches.  Psychiatric/Behavioral: Negative for dysphoric mood.       Objective:   Physical Exam  Constitutional: Vital signs are normal. She appears well-developed and well-nourished. She is cooperative.  Non-toxic appearance. She does not appear ill. No distress.  HENT:  Head: Normocephalic.  Right Ear: Hearing, tympanic membrane, external ear and ear canal normal.  Left Ear: Hearing, tympanic membrane, external ear and ear canal normal.  Nose: Nose normal.  Eyes: Conjunctivae, EOM and lids are normal. Pupils are equal, round, and reactive to light. Lids are everted and swept, no foreign bodies found.  Neck: Trachea normal and  normal range of motion. Neck supple. Carotid bruit is not present. No thyroid mass and no thyromegaly present.  Cardiovascular: Normal rate, regular rhythm, S1 normal, S2 normal, normal heart sounds and intact distal pulses.  Exam reveals no gallop.   No murmur heard. Pulmonary/Chest: Effort normal and breath sounds normal. No respiratory distress. She has no wheezes. She has no rhonchi. She has no rales.  Abdominal: Soft. Normal appearance and bowel sounds are normal. She exhibits no distension, no fluid wave, no abdominal bruit and no mass. There is no hepatosplenomegaly. There is no tenderness. There is no rebound, no guarding and no CVA tenderness. No hernia.  Lymphadenopathy:    She has no cervical adenopathy.    She has no axillary adenopathy.  Neurological: She is alert. She has normal strength. No cranial nerve deficit or sensory deficit.  Skin: Skin is warm, dry and intact. No rash noted.  Psychiatric: Her speech is normal and behavior is normal. Judgment normal. Her mood appears not anxious. Cognition and memory are normal. She does not exhibit a depressed mood.          Assessment & Plan:  The patient's preventative maintenance and recommended screening tests for an annual wellness exam were reviewed in full today. Brought up to date unless services declined.  Counselled on the importance of diet, exercise, and its role in overall health and mortality. The patient's FH and SH was reviewed, including their home life, tobacco status, and drug and alcohol status.   Vaccines: Given flu today. Sees  GYN  COLON: nml 06/2015  repeat in 10 years. Mammo: nml 2016

## 2015-12-06 ENCOUNTER — Telehealth: Payer: Self-pay | Admitting: Family Medicine

## 2015-12-06 MED ORDER — GUAIFENESIN-CODEINE 100-10 MG/5ML PO SYRP: 5.0000 mL | ORAL_SOLUTION | Freq: Every evening | ORAL | Status: DC | PRN

## 2015-12-06 NOTE — Telephone Encounter (Signed)
Robitussin AC called into [facility].  Ms. Bohan notified.

## 2015-12-06 NOTE — Telephone Encounter (Signed)
Patient Name: PAITEN BOIES  DOB: 1966/09/15    Initial Comment Caller states she's coughing, and wants cough medicine called in.   Nurse Assessment  Nurse: Stefano Gaul, RN, Dwana Curd Date/Time (Eastern Time): 12/06/2015 10:25:21 AM  Confirm and document reason for call. If symptomatic, describe symptoms. You must click the next button to save text entered. ---Caller states she has had a cold and cough. Cough is mainly at night. Has used Robitussin and it has not helped. Cold started on Tuesday. No fever She is out of town  Has the patient traveled out of the country within the last 30 days? ---No  Does the patient have any new or worsening symptoms? ---Yes  Will a triage be completed? ---Yes  Related visit to physician within the last 2 weeks? ---No  Does the PT have any chronic conditions? (i.e. diabetes, asthma, etc.) ---No  Is the patient pregnant or possibly pregnant? (Ask all females between the ages of 40-55) ---No  Is this a behavioral health or substance abuse call? ---No     Guidelines    Guideline Title Affirmed Question Affirmed Notes  Cough - Acute Non-Productive SEVERE coughing spells (e.g., whooping sound after coughing, vomiting after coughing)    Final Disposition User   See Physician within 24 Hours Stringer, RN, Dwana Curd    Comments  Caller is out of town at [facility] and she would like cough medication called in as the Robitussin is not helping. She would like cough medication [facility] into the [facility] [address] [facility] in [place], [place] phone: [phone]. Please call pt back and let her know whether or not cough medication will be called in.   Referrals  GO TO FACILITY REFUSED   Disagree/Comply: Disagree  Disagree/Comply Reason: Disagree with instructions

## 2015-12-06 NOTE — Telephone Encounter (Signed)
Call in rx for cough suppressant.. If pt not improving she should be seen at [facility].

## 2015-12-10 ENCOUNTER — Ambulatory Visit (INDEPENDENT_AMBULATORY_CARE_PROVIDER_SITE_OTHER): Payer: Managed Care, Other (non HMO) | Admitting: Family Medicine

## 2015-12-10 ENCOUNTER — Encounter: Payer: Self-pay | Admitting: Family Medicine

## 2015-12-10 VITALS — BP 100/62 | HR 91 | Temp 99.1°F | Ht 62.0 in | Wt 138.5 lb

## 2015-12-10 DIAGNOSIS — H6692 Otitis media, unspecified, left ear: Secondary | ICD-10-CM

## 2015-12-10 DIAGNOSIS — H6592 Unspecified nonsuppurative otitis media, left ear: Secondary | ICD-10-CM | POA: Insufficient documentation

## 2015-12-10 MED ORDER — AMOXICILLIN 500 MG PO CAPS: 1000.0000 mg | ORAL_CAPSULE | Freq: Three times a day (TID) | ORAL | Status: DC

## 2015-12-10 NOTE — Assessment & Plan Note (Signed)
Partially treated. Treat with amox x 10 days. Nasal saline and mucolytic.

## 2015-12-10 NOTE — Progress Notes (Signed)
   Subjective:    Patient ID: [name], female    DOB: [birth year], 50 y.o.   MRN: [id number]  Cough This is a new problem. The current episode started in the past 7 days. The problem has been waxing and waning. The cough is productive of sputum. Associated symptoms include ear pain, nasal congestion and a sore throat. Pertinent negatives include no fever, myalgias, shortness of breath or wheezing. Associated symptoms comments: Ear pain in last 3 days  lightheaded  occ chest pain . The symptoms are aggravated by lying down. Risk factors: nonsmoker. She has tried prescription cough suppressant (she has been using swimmers ear drops) for the symptoms. The treatment provided mild relief. There is no history of asthma, COPD or environmental allergies.    Social History /Family History/Past Medical History reviewed and updated if needed.   Review of Systems  Constitutional: Negative for fever.  HENT: Positive for ear pain and sore throat.   Respiratory: Positive for cough. Negative for shortness of breath and wheezing.   Musculoskeletal: Negative for myalgias.  Allergic/Immunologic: Negative for environmental allergies.       Objective:   Physical Exam  Constitutional: Vital signs are normal. She appears well-developed and well-nourished. She is cooperative.  Non-toxic appearance. She does not appear ill. No distress.  HENT:  Head: Normocephalic.  Right Ear: Hearing, external ear and ear canal normal. Tympanic membrane is not erythematous, not retracted and not bulging. A middle ear effusion is present.  Left Ear: Hearing, external ear and ear canal normal. Tympanic membrane is injected and erythematous. Tympanic membrane is not retracted and not bulging. A middle ear effusion is present.  Nose: Mucosal edema and rhinorrhea present. Right sinus exhibits no maxillary sinus tenderness and no frontal sinus tenderness. Left sinus exhibits no maxillary sinus tenderness and no frontal  sinus tenderness.  Mouth/Throat: Uvula is midline, oropharynx is clear and moist and mucous membranes are normal.  Eyes: Conjunctivae, EOM and lids are normal. Pupils are equal, round, and reactive to light. Lids are everted and swept, no foreign bodies found.  Neck: Trachea normal and normal range of motion. Neck supple. Carotid bruit is not present. No thyroid mass and no thyromegaly present.  Cardiovascular: Normal rate, regular rhythm, S1 normal, S2 normal, normal heart sounds, intact distal pulses and normal pulses.  Exam reveals no gallop and no friction rub.   No murmur heard. Pulmonary/Chest: Effort normal and breath sounds normal. No tachypnea. No respiratory distress. She has no decreased breath sounds. She has no wheezes. She has no rhonchi. She has no rales.  Neurological: She is alert.  Skin: Skin is warm, dry and intact. No rash noted.  Psychiatric: Her speech is normal and behavior is normal. Judgment normal. Her mood appears not anxious. Cognition and memory are normal. She does not exhibit a depressed mood.          Assessment & Plan:

## 2015-12-10 NOTE — Progress Notes (Signed)
Pre visit review using our clinic review tool, if applicable. No additional management support is needed unless otherwise documented below in the visit note. 

## 2015-12-10 NOTE — Patient Instructions (Signed)
Complete course of amoxicillin. Start mucinex DM twice daily. Start nasal saline spray 2-3 times a day.  Call if not improving as expected.

## 2016-04-03 ENCOUNTER — Encounter: Payer: Self-pay | Admitting: Genetic Counselor

## 2016-07-20 ENCOUNTER — Ambulatory Visit (INDEPENDENT_AMBULATORY_CARE_PROVIDER_SITE_OTHER): Payer: Managed Care, Other (non HMO)

## 2016-07-20 ENCOUNTER — Other Ambulatory Visit (INDEPENDENT_AMBULATORY_CARE_PROVIDER_SITE_OTHER): Payer: Managed Care, Other (non HMO)

## 2016-07-20 ENCOUNTER — Telehealth: Payer: Self-pay | Admitting: Family Medicine

## 2016-07-20 DIAGNOSIS — Z13220 Encounter for screening for lipoid disorders: Secondary | ICD-10-CM | POA: Diagnosis not present

## 2016-07-20 DIAGNOSIS — E221 Hyperprolactinemia: Secondary | ICD-10-CM

## 2016-07-20 DIAGNOSIS — Z23 Encounter for immunization: Secondary | ICD-10-CM | POA: Diagnosis not present

## 2016-07-20 LAB — COMPREHENSIVE METABOLIC PANEL
ALT: 12 U/L (ref 0–35)
AST: 15 U/L (ref 0–37)
Albumin: 4.5 g/dL (ref 3.5–5.2)
Alkaline Phosphatase: 60 U/L (ref 39–117)
BUN: 10 mg/dL (ref 6–23)
CO2: 29 mEq/L (ref 19–32)
Calcium: 9.7 mg/dL (ref 8.4–10.5)
Chloride: 104 mEq/L (ref 96–112)
Creatinine, Ser: 0.62 mg/dL (ref 0.40–1.20)
GFR: 107.8 mL/min (ref >60.00)
Glucose, Bld: 98 mg/dL (ref 70–99)
Potassium: 4.3 mEq/L (ref 3.5–5.1)
Sodium: 140 mEq/L (ref 135–145)
Total Bilirubin: 0.4 mg/dL (ref 0.2–1.2)
Total Protein: 7.2 g/dL (ref 6.0–8.3)

## 2016-07-20 LAB — LIPID PANEL
Cholesterol: 213 mg/dL — ABNORMAL HIGH (ref 0–200)
HDL: 83.5 mg/dL (ref >39.00)
LDL Cholesterol: 120 mg/dL — ABNORMAL HIGH (ref 0–99)
NonHDL: 129.03
Total CHOL/HDL Ratio: 3
Triglycerides: 45 mg/dL (ref 0.0–149.0)
VLDL: 9 mg/dL (ref 0.0–40.0)

## 2016-07-20 NOTE — Telephone Encounter (Signed)
-----   Message from Baldomero LamyNatasha C Chavers sent at 07/13/2016  2:20 PM EDT ----- Regarding: Cpx labs Mon 10/9, need orders. Thanks! :-) Please order  future cpx labs for pt's upcoming lab appt. Thanks Rodney Boozeasha

## 2016-07-21 LAB — PROLACTIN: Prolactin: 8 ng/mL

## 2016-07-28 ENCOUNTER — Encounter (INDEPENDENT_AMBULATORY_CARE_PROVIDER_SITE_OTHER): Payer: Self-pay

## 2016-07-28 ENCOUNTER — Ambulatory Visit (INDEPENDENT_AMBULATORY_CARE_PROVIDER_SITE_OTHER): Payer: Managed Care, Other (non HMO) | Admitting: Family Medicine

## 2016-07-28 ENCOUNTER — Encounter: Payer: Self-pay | Admitting: Family Medicine

## 2016-07-28 VITALS — BP 110/70 | HR 76 | Temp 98.4°F | Ht 62.0 in | Wt 136.0 lb

## 2016-07-28 DIAGNOSIS — E221 Hyperprolactinemia: Secondary | ICD-10-CM | POA: Diagnosis not present

## 2016-07-28 DIAGNOSIS — Z0000 Encounter for general adult medical examination without abnormal findings: Secondary | ICD-10-CM | POA: Diagnosis not present

## 2016-07-28 MED ORDER — DICLOFENAC SODIUM 75 MG PO TBEC: 75.0000 mg | DELAYED_RELEASE_TABLET | ORAL | 0 refills | Status: DC | PRN

## 2016-07-28 NOTE — Progress Notes (Signed)
Pre visit review using our clinic review tool, if applicable. No additional management support is needed unless otherwise documented below in the visit note. 

## 2016-07-28 NOTE — Patient Instructions (Addendum)
Keep up with working on healthy eating and regular exercise.  Try to wean off nexium if able, but restart if symptoms return.  Can sue diclofenac for low back pain.. Limit and take Nexium while on..[name]

## 2016-07-28 NOTE — Progress Notes (Signed)
50 year old female presents for annual exam. Has appt for GYN.  Doing well overall. Diet:  Moderate Exercise: none, plans to get  BP Readings from Last 3 Encounters:  07/28/16 110/70  12/10/15 100/62  07/18/15 110/60   Reviewed labs in detail with pt. Lab Results  Component Value Date   CHOL 213 (H) 07/20/2016   HDL 83.50 07/20/2016   LDLCALC 120 (H) 07/20/2016   LDLDIRECT 121.7 09/29/2013   TRIG 45.0 07/20/2016   CHOLHDL 3 07/20/2016   HX of hyperprolactinemia: prolactin level  8 in nml range.  Wt Readings from Last 3 Encounters:  07/28/16 136 lb (61.7 kg)  12/10/15 138 lb 8 oz (62.8 kg)  07/18/15 134 lb 8 oz (61 kg)   Body mass index is 24.87 kg/m.   No migraines in over 1 year.   GERD: resolved on every other day nexium.. Will try to wean off.    Social History /Family History/Past Medical History reviewed and updated if needed.    Review of Systems  Constitutional: Negative for fever and fatigue.  HENT: Negative for congestion and ear pain.   Eyes: Negative for pain.  Respiratory: Negative for cough, chest tightness and shortness of breath.   Cardiovascular: Negative for chest pain, palpitations and leg swelling.  Gastrointestinal: Negative for abdominal pain.  Genitourinary: Negative for dysuria and vaginal bleeding.  Musculoskeletal: Positive for back pain.  Neurological: Negative for syncope, light-headedness and headaches.  Psychiatric/Behavioral: Negative for dysphoric mood.       Objective:   Physical Exam  Constitutional: Vital signs are normal. She appears well-developed and well-nourished. She is cooperative.  Non-toxic appearance. She does not appear ill. No distress.  HENT:  Head: Normocephalic.  Right Ear: Hearing, tympanic membrane, external ear and ear canal normal.  Left Ear: Hearing, tympanic membrane, external ear and ear canal normal.  Nose: Nose normal.  Eyes: Conjunctivae, EOM and lids are normal. Pupils are equal, round, and  reactive to light. Lids are everted and swept, no foreign bodies found.  Neck: Trachea normal and normal range of motion. Neck supple. Carotid bruit is not present. No thyroid mass and no thyromegaly present.  Cardiovascular: Normal rate, regular rhythm, S1 normal, S2 normal, normal heart sounds and intact distal pulses.  Exam reveals no gallop.   No murmur heard. Pulmonary/Chest: Effort normal and breath sounds normal. No respiratory distress. She has no wheezes. She has no rhonchi. She has no rales.  Abdominal: Soft. Normal appearance and bowel sounds are normal. She exhibits no distension, no fluid wave, no abdominal bruit and no mass. There is no hepatosplenomegaly. There is no tenderness. There is no rebound, no guarding and no CVA tenderness. No hernia.  Lymphadenopathy:    She has no cervical adenopathy.    She has no axillary adenopathy.  Neurological: She is alert. She has normal strength. No cranial nerve deficit or sensory deficit.  Skin: Skin is warm, dry and intact. No rash noted.  Psychiatric: Her speech is normal and behavior is normal. Judgment normal. Her mood appears not anxious. Cognition and memory are normal. She does not exhibit a depressed mood.          Assessment & Plan:  The patient's preventative maintenance and recommended screening tests for an annual wellness exam were reviewed in full today. Brought up to date unless services declined.  Counselled on the importance of diet, exercise, and its role in overall health and mortality. The patient's FH and SH was reviewed, including their  home life, tobacco status, and drug and alcohol status.   Vaccines: Has had flu today. Sees GYN  COLON: nml 06/2015  repeat in 10 years. Mammo: nml 11/2015  refused HIV testing

## 2016-07-28 NOTE — Assessment & Plan Note (Signed)
Stable prolactin levels. Follow yearly.

## 2016-08-12 ENCOUNTER — Other Ambulatory Visit: Payer: Self-pay | Admitting: Family Medicine

## 2016-11-26 LAB — HM MAMMOGRAPHY

## 2017-03-23 ENCOUNTER — Ambulatory Visit (INDEPENDENT_AMBULATORY_CARE_PROVIDER_SITE_OTHER)
Admission: RE | Admit: 2017-03-23 | Discharge: 2017-03-23 | Disposition: A | Discharge status: Home / Self Care | Payer: Managed Care, Other (non HMO) | Source: Ambulatory Visit | Attending: Family Medicine | Admitting: Family Medicine

## 2017-03-23 ENCOUNTER — Ambulatory Visit (INDEPENDENT_AMBULATORY_CARE_PROVIDER_SITE_OTHER): Payer: Managed Care, Other (non HMO) | Admitting: Family Medicine

## 2017-03-23 DIAGNOSIS — R05 Cough: Secondary | ICD-10-CM | POA: Diagnosis not present

## 2017-03-23 DIAGNOSIS — R059 Cough, unspecified: Secondary | ICD-10-CM

## 2017-03-23 NOTE — Assessment & Plan Note (Signed)
?   Pleurisy given referred pain to her upper shoulder/scapula area and relief with NSAIDs. Pt is quite nervous about her cough- will get CXR today for further evaluation. Lung exam and VSS reassuring. The patient indicates understanding of these issues and agrees with the plan.

## 2017-03-23 NOTE — Progress Notes (Signed)
Subjective:   Patient ID: [name], female    DOB: 1966/07/13, 51 y.o.   MRN: [id number]  [name] is a pleasant 51 y.o. year old female patient of Dr. Ermalene Searing, new to me, who presents to clinic today with Cough  on 03/23/2017  HPI:   Has had a dry, hacking cough for two weeks. She didn't have any other symptoms- not sure if it was allergies. No fevers, or chills. No nasal congestion that she is aware of.  She wanted to be seen today because over the weekend, she also developed left upper shoulder pain with this cough.  Cough and pain both improved with some Ibuprofen last night.  Current Outpatient Prescriptions on File Prior to Visit  Medication Sig Dispense Refill  . aspirin 81 MG tablet Take 81 mg by mouth at bedtime.      . baclofen (LIORESAL) 10 MG tablet Take 10 mg by mouth every other day.    . Cyanocobalamin (VITAMIN B 12 PO) Take 1,000 mcg by mouth daily.    . diclofenac (VOLTAREN) 75 MG EC tablet Take 1 tablet (75 mg total) by mouth as needed. 30 tablet 0  . esomeprazole (NEXIUM) 20 MG capsule Take 20 mg by mouth every other day.     [name] ESTRACE VAGINAL 0.1 MG/GM vaginal cream INSERT 1 GRAM VGAINALLY 2 TIMES A WEEK  0   No current facility-administered medications on file prior to visit.     Allergies  Allergen Reactions  . Other     Walnut and pecans  . Sulfonamide Derivatives     Past Medical History:  Diagnosis Date  . Chest pain   . Congenital anomalies of pancreas   . Esophageal spasm   . GERD (gastroesophageal reflux disease)     Past Surgical History:  Procedure Laterality Date  . APPENDECTOMY    . OOPHORECTOMY     Right  . ROTATOR CUFF REPAIR      Family History  Problem Relation Age of Onset  . Emphysema Paternal Grandfather   . Allergies Mother   . Asthma Mother   . Hypertension Mother   . Heart disease Maternal Grandmother   . Lymphoma Maternal Grandfather   . Breast cancer Maternal Aunt   . Hyperlipidemia Father    . Heart disease Father 10  . Hypothyroidism Sister 64       post surgical    Social History   Social History  . Marital status: Married    Spouse name: N/A  . Number of children: 0  . Years of education: N/A   Occupational History  . Not on file.   Social History Main Topics  . Smoking status: Never Smoker  . Smokeless tobacco: Never Used  . Alcohol use No  . Drug use: No  . Sexual activity: Not on file   Other Topics Concern  . Not on file   Social History Narrative  . No narrative on file   The PMH, PSH, Social History, Family History, Medications, and allergies have been reviewed in [facility], and have been updated if relevant.   Review of Systems  Constitutional: Negative.   HENT: Negative for congestion, dental problem, drooling, ear discharge, postnasal drip, rhinorrhea, sinus pain, sinus pressure, sneezing, sore throat, tinnitus and trouble swallowing.   Respiratory: Positive for cough.   Cardiovascular: Negative for palpitations and leg swelling.  Gastrointestinal: Negative.   Musculoskeletal: Positive for back pain, myalgias and neck pain.  All other systems reviewed  and are negative.      Objective:    BP 102/70   Pulse 74   Temp 98.2 F (36.8 C)   Ht 5\' 2"  (1.575 m)   Wt 131 lb (59.4 kg)   SpO2 98%   BMI 23.96 kg/m    Physical Exam  Constitutional: She is oriented to person, place, and time. She appears well-developed and well-nourished. No distress.  HENT:  Head: Normocephalic and atraumatic.  Eyes: Conjunctivae are normal.  Cardiovascular: Normal rate and regular rhythm.   Pulmonary/Chest: Effort normal and breath sounds normal. No respiratory distress. She has no wheezes. She has no rales. She exhibits no tenderness.  Musculoskeletal: Normal range of motion.  Neurological: She is alert and oriented to person, place, and time. No cranial nerve deficit.  Skin: Skin is warm and dry. She is not diaphoretic.  Psychiatric: She has a normal mood  and affect. Her behavior is normal. Judgment and thought content normal.  Nursing note and vitals reviewed.         Assessment & Plan:   Cough - Plan: DG Chest 2 View No Follow-up on file.

## 2017-06-15 ENCOUNTER — Ambulatory Visit (INDEPENDENT_AMBULATORY_CARE_PROVIDER_SITE_OTHER): Payer: Managed Care, Other (non HMO) | Admitting: Family Medicine

## 2017-06-15 ENCOUNTER — Telehealth: Payer: Self-pay | Admitting: Family Medicine

## 2017-06-15 ENCOUNTER — Encounter: Payer: Self-pay | Admitting: Family Medicine

## 2017-06-15 VITALS — BP 100/52 | HR 80 | Temp 98.4°F | Ht 62.0 in | Wt 128.5 lb

## 2017-06-15 DIAGNOSIS — R079 Chest pain, unspecified: Secondary | ICD-10-CM

## 2017-06-15 MED ORDER — MELOXICAM 15 MG PO TABS: 15.0000 mg | ORAL_TABLET | Freq: Every day | ORAL | 0 refills | Status: DC

## 2017-06-15 NOTE — Telephone Encounter (Signed)
Pt states has lt sided dull ache at Lt breast on and off for over 1 month. Pain level now 3.  No SOB. No h/a or dizziness. Pt does not want to go to ED; pt scheduled appt to see Dr Ermalene SearingBedsole today at 3:15.

## 2017-06-15 NOTE — Patient Instructions (Addendum)
Start upper chest wall wall stretching. Start meloxicam daily or as needed for pain and inflammation. Call if not improving in next 3-4 weeks.   Costochondritis Costochondritis is swelling and irritation (inflammation) of the tissue (cartilage) that connects your ribs to your breastbone (sternum). This causes pain in the front of your chest. The pain usually starts gradually and involves more than one rib. What are the causes? The exact cause of this condition is not always known. It results from stress on the cartilage where your ribs attach to your sternum. The cause of this stress could be:  Chest injury (trauma).  Exercise or activity, such as lifting.  Severe coughing.  What increases the risk? You may be at higher risk for this condition if you:  Are female.  Are 34?52 years old.  Recently started a new exercise or work activity.  Have low levels of vitamin D.  Have a condition that makes you cough frequently.  What are the signs or symptoms? The main symptom of this condition is chest pain. The pain:  Usually starts gradually and can be sharp or dull.  Gets worse with deep breathing, coughing, or exercise.  Gets better with rest.  May be worse when you press on the sternum-rib connection (tenderness).  How is this diagnosed? This condition is diagnosed based on your symptoms, medical history, and a physical exam. Your health care provider will check for tenderness when pressing on your sternum. This is the most important finding. You may also have tests to rule out other causes of chest pain. These may include:  A chest X-ray to check for lung problems.  An electrocardiogram (ECG) to see if you have a heart problem that could be causing the pain.  An imaging scan to rule out a chest or rib fracture.  How is this treated? This condition usually goes away on its own over time. Your health care provider may prescribe an NSAID to reduce pain and inflammation.  Your health care provider may also suggest that you:  Rest and avoid activities that make pain worse.  Apply heat or cold to the area to reduce pain and inflammation.  Do exercises to stretch your chest muscles.  If these treatments do not help, your health care provider may inject a numbing medicine at the sternum-rib connection to help relieve the pain. Follow these instructions at home:  Avoid activities that make pain worse. This includes any activities that use chest, abdominal, and side muscles.  If directed, put ice on the painful area: ? Put ice in a plastic bag. ? Place a towel between your skin and the bag. ? Leave the ice on for 20 minutes, 2-3 times a day.  If directed, apply heat to the affected area as often as told by your health care provider. Use the heat source that your health care provider recommends, such as a moist heat pack or a heating pad. ? Place a towel between your skin and the heat source. ? Leave the heat on for 20-30 minutes. ? Remove the heat if your skin turns bright red. This is especially important if you are unable to feel pain, heat, or cold. You may have a greater risk of getting burned.  Take over-the-counter and prescription medicines only as told by your health care provider.  Return to your normal activities as told by your health care provider. Ask your health care provider what activities are safe for you.  Keep all follow-up visits as told by  your health care provider. This is important. Contact a health care provider if:  You have chills or a fever.  Your pain does not go away or it gets worse.  You have a cough that does not go away (is persistent). Get help right away if:  You have shortness of breath. This information is not intended to replace advice given to you by your health care provider. Make sure you discuss any questions you have with your health care provider. Document Released: 07/08/2005 Document Revised: 04/17/2016  Document Reviewed: 01/22/2016 Elsevier Interactive Patient Education  [employer]2018 [employer]

## 2017-06-15 NOTE — Progress Notes (Signed)
Subjective:    Patient ID: Anna FergusonBrenda Walker Krusemark, female    DOB: 1966/03/28, 51 y.o.   MRN: [id number]  HPI    51 year old female pt presents with new onset pain in chest in last month.  Pain in left upper chest, radiates to left shoulder blade, left arm.. Intermittant , off and on , no specific trigger  At rest.  Lasts  All day, ache, sore pain.. Area is sore to touch  She feel left upper chest bone is thicker than right.  Pain is not with exertion.  No SOB, but occ cannot get a deep breath.  no N/V, no dizziness, no sweats. Nml energy.  Has occ dry cough   Advil helps some with pain... 600 mg daily for few days.[name]. Helps it calm down.    CXR: clear at OV 03/2017    No change with eating, no  Heartburn.  Nml ROM in left shoulder, no trigger with movement of shoulder.  She has been seeing GI for epigastric pain, h/o pancreatic divisum.  04/2017 MRI abd.. Unremarkable.   11/14/2015  mammogram: nml  Blood pressure (!) 100/52, pulse 80, temperature 98.4 F (36.9 C), temperature source Oral, height 5\' 2"  (1.575 m), weight 128 lb 8 oz (58.3 kg), SpO2 98 %.  Review of Systems  Constitutional: Negative for fatigue and fever.  HENT: Negative for ear pain.   Eyes: Negative for pain.  Respiratory: Negative for chest tightness and shortness of breath.   Cardiovascular: Positive for chest pain. Negative for palpitations and leg swelling.  Gastrointestinal: Negative for abdominal pain.  Genitourinary: Negative for dysuria.       Objective:   Physical Exam  Constitutional: Vital signs are normal. She appears well-developed and well-nourished. She is cooperative.  Non-toxic appearance. She does not appear ill. No distress.  HENT:  Head: Normocephalic.  Right Ear: Hearing, tympanic membrane, external ear and ear canal normal. Tympanic membrane is not erythematous, not retracted and not bulging.  Left Ear: Hearing, tympanic membrane, external ear and ear canal normal. Tympanic membrane is  not erythematous, not retracted and not bulging.  Nose: No mucosal edema or rhinorrhea. Right sinus exhibits no maxillary sinus tenderness and no frontal sinus tenderness. Left sinus exhibits no maxillary sinus tenderness and no frontal sinus tenderness.  Mouth/Throat: Uvula is midline, oropharynx is clear and moist and mucous membranes are normal.  Eyes: Pupils are equal, round, and reactive to light. Conjunctivae, EOM and lids are normal. Lids are everted and swept, no foreign bodies found.  Neck: Trachea normal and normal range of motion. Neck supple. Carotid bruit is not present. No thyroid mass and no thyromegaly present.  Cardiovascular: Normal rate, regular rhythm, S1 normal, S2 normal, normal heart sounds, intact distal pulses and normal pulses.  Exam reveals no gallop and no friction rub.   No murmur heard. Pulmonary/Chest: Effort normal and breath sounds normal. No tachypnea. No respiratory distress. She has no decreased breath sounds. She has no wheezes. She has no rhonchi. She has no rales. Chest wall is not dull to percussion. She exhibits tenderness and bony tenderness. She exhibits no mass and no laceration. Left breast exhibits no inverted nipple, no mass, no nipple discharge, no skin change and no tenderness. Breasts are symmetrical.    Abdominal: Soft. Normal appearance and bowel sounds are normal. There is no tenderness.  Neurological: She is alert.  Skin: Skin is warm, dry and intact. No rash noted.  Psychiatric: Her speech is normal and behavior  is normal. Judgment and thought content normal. Her mood appears not anxious. Cognition and memory are normal. She does not exhibit a depressed mood.          Assessment & Plan:

## 2017-06-15 NOTE — Telephone Encounter (Signed)
Patient Name: [name]  DOB: [birth year]    Initial Comment Caller states she has a ache on the top of her left breast. Chest. Comes and goes, has been going on for a month.   Nurse Assessment  Nurse: Stefano GaulStringer, RN, Dwana CurdVera Date/Time (Eastern Time): 06/15/2017 12:35:12 PM  Confirm and document reason for call. If symptomatic, describe symptoms. ---Caller states she has had an ache on top of her left breast that she has had at least a month. Pain is intermittent. Advil helps the pain. No SOB. Has had ache all morning.  Does the patient have any new or worsening symptoms? ---Yes  Will a triage be completed? ---Yes  Related visit to physician within the last 2 weeks? ---No  Does the PT have any chronic conditions? (i.e. diabetes, asthma, etc.) ---No  Is the patient pregnant or possibly pregnant? (Ask all females between the ages of 2912-55) ---No  Is this a behavioral health or substance abuse call? ---No     Guidelines    Guideline Title Affirmed Question Affirmed Notes  Chest Pain Chest pain lasts > 5 minutes (Exceptions: chest pain occurring > 3 days ago and now asymptomatic; same as previously diagnosed heartburn and has accompanying sour taste in mouth)    Final Disposition User   Go to ED Now (or PCP triage) Stefano GaulStringer, RN, Vera    Comments  caller states she does not want to go to the ER. She can not come today to the office but would like appt for tomorrow am.  called office and spoke to Robin and gave report that pt has ache above left breast. Has had pain all morning. Has had pain for at least a month. Pain is intermittent and did not have pain yesterday. No SOB.   Referrals  GO TO FACILITY REFUSED   Disagree/Comply: Comply

## 2017-06-18 NOTE — Assessment & Plan Note (Signed)
Pain most consistent with costochondritis given ttp on exam over left costochondral junction.  Nml EKG, no red flags for CVD. Low risk for CVD.[name]. No CVD risk equivalent, moderate cholesterol, no early family history and nonsmoker,  Treat with meloxicam, continue PPT, start chest wall stretching and ice.

## 2017-06-22 ENCOUNTER — Encounter: Payer: Self-pay | Admitting: Family Medicine

## 2017-07-20 ENCOUNTER — Ambulatory Visit (INDEPENDENT_AMBULATORY_CARE_PROVIDER_SITE_OTHER): Payer: Managed Care, Other (non HMO)

## 2017-07-20 DIAGNOSIS — Z23 Encounter for immunization: Secondary | ICD-10-CM | POA: Diagnosis not present

## 2017-07-23 ENCOUNTER — Encounter: Payer: Self-pay | Admitting: Family Medicine

## 2017-07-23 ENCOUNTER — Ambulatory Visit (INDEPENDENT_AMBULATORY_CARE_PROVIDER_SITE_OTHER): Payer: Managed Care, Other (non HMO) | Admitting: Family Medicine

## 2017-07-23 VITALS — BP 124/62 | HR 93 | Temp 98.8°F | Ht 62.0 in | Wt 127.5 lb

## 2017-07-23 DIAGNOSIS — R300 Dysuria: Secondary | ICD-10-CM | POA: Diagnosis not present

## 2017-07-23 LAB — POC URINALSYSI DIPSTICK (AUTOMATED)
Bilirubin, UA: NEGATIVE
Blood, UA: NEGATIVE
Glucose, UA: NEGATIVE
Ketones, UA: NEGATIVE
Leukocytes, UA: NEGATIVE
Nitrite, UA: NEGATIVE
Protein, UA: NEGATIVE
Spec Grav, UA: 1.025 (ref 1.010–1.025)
Urobilinogen, UA: 0.2 E.U./dL
pH, UA: 6 (ref 5.0–8.0)

## 2017-07-23 MED ORDER — MELOXICAM 15 MG PO TABS: 15.0000 mg | ORAL_TABLET | Freq: Every day | ORAL | 0 refills | Status: DC

## 2017-07-23 MED ORDER — BACLOFEN 10 MG PO TABS: 5.0000 mg | ORAL_TABLET | ORAL | 0 refills | Status: DC

## 2017-07-23 MED ORDER — NITROFURANTOIN MONOHYD MACRO 100 MG PO CAPS: 100.0000 mg | ORAL_CAPSULE | Freq: Two times a day (BID) | ORAL | 0 refills | Status: DC

## 2017-07-23 NOTE — Progress Notes (Signed)
   Subjective:    Patient ID: [name], female    DOB: 10-Oct-1966, 51 y.o.   MRN: [id number]  Dysuria   This is a new problem. The current episode started in the past 7 days. The problem occurs every urination. The problem has been waxing and waning. The quality of the pain is described as burning. The pain is mild. There has been no fever. She is not sexually active. There is no history of pyelonephritis. Associated symptoms include frequency and urgency. Pertinent negatives include no chills, discharge, flank pain, hematuria, nausea or vomiting. She has tried increased fluids for the symptoms. The treatment provided mild relief.    No recent antibiotics in month.  Last UTI over year ago.    Review of Systems  Constitutional: Negative for chills.  Gastrointestinal: Negative for nausea and vomiting.  Genitourinary: Positive for dysuria, frequency and urgency. Negative for flank pain and hematuria.       Objective:   Physical Exam  Constitutional: Vital signs are normal. She appears well-developed and well-nourished. She is cooperative.  Non-toxic appearance. She does not appear ill. No distress.  HENT:  Head: Normocephalic.  Right Ear: Hearing, tympanic membrane, external ear and ear canal normal. Tympanic membrane is not erythematous, not retracted and not bulging.  Left Ear: Hearing, tympanic membrane, external ear and ear canal normal. Tympanic membrane is not erythematous, not retracted and not bulging.  Nose: No mucosal edema or rhinorrhea. Right sinus exhibits no maxillary sinus tenderness and no frontal sinus tenderness. Left sinus exhibits no maxillary sinus tenderness and no frontal sinus tenderness.  Mouth/Throat: Uvula is midline, oropharynx is clear and moist and mucous membranes are normal.  Eyes: Pupils are equal, round, and reactive to light. Conjunctivae, EOM and lids are normal. Lids are everted and swept, no foreign bodies found.  Neck: Trachea normal and  normal range of motion. Neck supple. Carotid bruit is not present. No thyroid mass and no thyromegaly present.  Cardiovascular: Normal rate, regular rhythm, S1 normal, S2 normal, normal heart sounds, intact distal pulses and normal pulses.  Exam reveals no gallop and no friction rub.   No murmur heard. Pulmonary/Chest: Effort normal and breath sounds normal. No tachypnea. No respiratory distress. She has no decreased breath sounds. She has no wheezes. She has no rhonchi. She has no rales.  Abdominal: Soft. Normal appearance and bowel sounds are normal. There is no tenderness.  Neurological: She is alert.  Skin: Skin is warm, dry and intact. No rash noted.  Psychiatric: Her speech is normal and behavior is normal. Judgment and thought content normal. Her mood appears not anxious. Cognition and memory are normal. She does not exhibit a depressed mood.          Assessment & Plan:

## 2017-07-23 NOTE — Patient Instructions (Addendum)
Push fluids. Avoid acidic foods, stop peppermint patties.  If sympoms not improving over weekend.[name] Rx for antibiotics.

## 2017-07-23 NOTE — Assessment & Plan Note (Signed)
No clear UTI.[name] Likely improved with fluids.  Possible bladder spasm from irritants.   Continue to push fluids but if symptoms return complete antibiotics.

## 2017-08-06 ENCOUNTER — Telehealth: Payer: Self-pay | Admitting: *Deleted

## 2017-08-06 ENCOUNTER — Telehealth: Payer: Self-pay | Admitting: Family Medicine

## 2017-08-06 DIAGNOSIS — Z13220 Encounter for screening for lipoid disorders: Secondary | ICD-10-CM

## 2017-08-06 DIAGNOSIS — E221 Hyperprolactinemia: Secondary | ICD-10-CM

## 2017-08-06 NOTE — Telephone Encounter (Signed)
-----   Message from Dixie DialsAraceli Valencia, [place] sent at 07/29/2017  9:10 AM EDT ----- Regarding: LABS  Patient has an appointment on 08/09/17, please order labs. Thank you!

## 2017-08-06 NOTE — Telephone Encounter (Signed)
Copied from CRM [id number]. Topic: Inquiry >> Aug 06, 2017 11:35 AM Alexander BergeronBarksdale, Harvey B wrote: Reason for CRM: when pt comes in on Monday for her blood work she wants to make sure they check her prolactin levels, she said this is needed for her blood work for another doctor

## 2017-08-06 NOTE — Telephone Encounter (Signed)
Labs ordered and prolactin included.

## 2017-08-09 ENCOUNTER — Other Ambulatory Visit (INDEPENDENT_AMBULATORY_CARE_PROVIDER_SITE_OTHER): Payer: Managed Care, Other (non HMO)

## 2017-08-09 DIAGNOSIS — Z13220 Encounter for screening for lipoid disorders: Secondary | ICD-10-CM | POA: Diagnosis not present

## 2017-08-09 DIAGNOSIS — E221 Hyperprolactinemia: Secondary | ICD-10-CM

## 2017-08-09 LAB — COMPREHENSIVE METABOLIC PANEL
ALT: 12 U/L (ref 0–35)
AST: 14 U/L (ref 0–37)
Albumin: 4.5 g/dL (ref 3.5–5.2)
Alkaline Phosphatase: 57 U/L (ref 39–117)
BUN: 8 mg/dL (ref 6–23)
CO2: 29 mEq/L (ref 19–32)
Calcium: 9.6 mg/dL (ref 8.4–10.5)
Chloride: 106 mEq/L (ref 96–112)
Creatinine, Ser: 0.52 mg/dL (ref 0.40–1.20)
GFR: 131.52 mL/min (ref >60.00)
Glucose, Bld: 102 mg/dL — ABNORMAL HIGH (ref 70–99)
Potassium: 4.5 mEq/L (ref 3.5–5.1)
Sodium: 143 mEq/L (ref 135–145)
Total Bilirubin: 0.4 mg/dL (ref 0.2–1.2)
Total Protein: 6.9 g/dL (ref 6.0–8.3)

## 2017-08-09 LAB — LIPID PANEL
Cholesterol: 183 mg/dL (ref 0–200)
HDL: 79.1 mg/dL (ref >39.00)
LDL Cholesterol: 91 mg/dL (ref 0–99)
NonHDL: 104.3
Total CHOL/HDL Ratio: 2
Triglycerides: 68 mg/dL (ref 0.0–149.0)
VLDL: 13.6 mg/dL (ref 0.0–40.0)

## 2017-08-10 LAB — PROLACTIN: Prolactin: 8.6 ng/mL

## 2017-08-12 ENCOUNTER — Encounter: Payer: Self-pay | Admitting: Family Medicine

## 2017-08-12 ENCOUNTER — Ambulatory Visit (INDEPENDENT_AMBULATORY_CARE_PROVIDER_SITE_OTHER): Payer: Managed Care, Other (non HMO) | Admitting: Family Medicine

## 2017-08-12 VITALS — BP 110/60 | HR 76 | Temp 98.5°F | Ht 62.0 in | Wt 128.0 lb

## 2017-08-12 DIAGNOSIS — Z0000 Encounter for general adult medical examination without abnormal findings: Secondary | ICD-10-CM

## 2017-08-12 DIAGNOSIS — E221 Hyperprolactinemia: Secondary | ICD-10-CM

## 2017-08-12 DIAGNOSIS — N644 Mastodynia: Secondary | ICD-10-CM | POA: Diagnosis not present

## 2017-08-12 NOTE — Patient Instructions (Signed)
Please stop at the front desk to set up referral.  

## 2017-08-12 NOTE — Assessment & Plan Note (Signed)
Stable prolactin.. Check yearly given pt history.

## 2017-08-12 NOTE — Assessment & Plan Note (Signed)
Likely cyst change but pt very concerned and with family history of breast cancer.

## 2017-08-12 NOTE — Progress Notes (Signed)
Subjective:    Patient ID: [name], female    DOB: [birth year], 51 y.o.   MRN: [id number]  HPI  The patient is here for annual wellness exam and preventative care.     Continued to have sore are in left upper chest.. GYN felt cyst.. She has strong family history of breast cancer.  She is very worried about breast cancer changes.  Hyperprolactinemia:  Prolactin 8.6 Stable, check yearly.  Elevated Cholesterol:  Excellent control with diet. Lab Results  Component Value Date   CHOL 183 08/09/2017   HDL 79.10 08/09/2017   LDLCALC 91 08/09/2017   LDLDIRECT 121.7 09/29/2013   TRIG 68.0 08/09/2017   CHOLHDL 2 08/09/2017  Using medications without problems: Muscle aches:  Diet compliance: low fat, less red meat, no bacon Exercise: walking some Other complaints: Body mass index is 23.41 kg/m. Wt Readings from Last 3 Encounters:  08/12/17 128 lb (58.1 kg)  07/23/17 127 lb 8 oz (57.8 kg)  06/15/17 128 lb 8 oz (58.3 kg)    Social History /Family History/Past Medical History reviewed in detail and updated in EMR if needed. Blood pressure 110/60, pulse 76, temperature 98.5 F (36.9 C), temperature source Oral, height 5\' 2"  (1.575 m), weight 128 lb (58.1 kg).  Review of Systems  Constitutional: Negative for fatigue and fever.  HENT: Negative for congestion.   Eyes: Negative for pain.  Respiratory: Negative for cough and shortness of breath.   Cardiovascular: Negative for chest pain, palpitations and leg swelling.  Gastrointestinal: Negative for abdominal pain.  Genitourinary: Negative for dysuria and vaginal bleeding.  Musculoskeletal: Negative for back pain.  Neurological: Negative for syncope, light-headedness and headaches.  Psychiatric/Behavioral: Negative for dysphoric mood.       Objective:   Physical Exam  Constitutional: Vital signs are normal. She appears well-developed and well-nourished. She is cooperative.  Non-toxic appearance. She does not appear ill.  No distress.  HENT:  Head: Normocephalic.  Right Ear: Hearing, tympanic membrane, external ear and ear canal normal.  Left Ear: Hearing, tympanic membrane, external ear and ear canal normal.  Nose: Nose normal.  Eyes: Pupils are equal, round, and reactive to light. Conjunctivae, EOM and lids are normal. Lids are everted and swept, no foreign bodies found.  Neck: Trachea normal and normal range of motion. Neck supple. Carotid bruit is not present. No thyroid mass and no thyromegaly present.  Cardiovascular: Normal rate, regular rhythm, S1 normal, S2 normal, normal heart sounds and intact distal pulses.  Exam reveals no gallop.   No murmur heard. Pulmonary/Chest: Effort normal and breath sounds normal. No respiratory distress. She has no wheezes. She has no rhonchi. She has no rales.  Abdominal: Soft. Normal appearance and bowel sounds are normal. She exhibits no distension, no fluid wave, no abdominal bruit and no mass. There is no hepatosplenomegaly. There is no tenderness. There is no rebound, no guarding and no CVA tenderness. No hernia.  Genitourinary: There is breast tenderness.  Genitourinary Comments: Left breat soreness and cystic changes at 11 oclock  Lymphadenopathy:    She has no cervical adenopathy.    She has no axillary adenopathy.  Neurological: She is alert. She has normal strength. No cranial nerve deficit or sensory deficit.  Skin: Skin is warm, dry and intact. No rash noted.  Psychiatric: Her speech is normal and behavior is normal. Judgment normal. Her mood appears not anxious. Cognition and memory are normal. She does not exhibit a depressed mood.  Assessment & Plan:  The patient's preventative maintenance and recommended screening tests for an annual wellness exam were reviewed in full today. Brought up to date unless services declined.  Counselled on the importance of diet, exercise, and its role in overall health and mortality. The patient's FH and SH was  reviewed, including their home life, tobacco status, and drug and alcohol status.   Vaccines: uptodate Sees GYN  COLON: nml 06/2015 repeat in 10 years. Mammo: nml 11/2016  refused HIV testing  nonsmoker

## 2017-08-18 ENCOUNTER — Encounter: Payer: Self-pay | Admitting: Family Medicine

## 2017-08-18 LAB — HM MAMMOGRAPHY

## 2017-08-24 ENCOUNTER — Encounter: Payer: Self-pay | Admitting: Family Medicine

## 2017-10-01 ENCOUNTER — Ambulatory Visit: Payer: Self-pay

## 2017-10-01 NOTE — Telephone Encounter (Signed)
Pt called about rash to side of neck near collar bone. and mild edema underneath both eyes. Pt states that she is having some redness with the under eye swelling. Pt denies SOB, fever.  Both protocols recommend home care. Care advice given.  Reason for Disposition . Mild localized rash . Eyelid swelling from suspected mild irritant  Answer Assessment - Initial Assessment Questions 1. APPEARANCE of RASH: "Describe the rash."      Red line in a neck crease the length is a couple of inches 2. LOCATION: "Where is the rash located?"      Near the collar bone area 3. NUMBER: "How many spots are there?"      No spots just red no other areas noted 4. SIZE: "How big are the spots?" (Inches, centimeters or compare to size of a coin)      Line near the collar bone 2 inches in length 5. ONSET: "When did the rash start?"      This am  6. ITCHING: "Does the rash itch?" If so, ask: "How bad is the itch?"  (Scale 1-10; or mild, moderate, severe)     Mild itch this am but doesn't itch after shower 7. PAIN: "Does the rash hurt?" If so, ask: "How bad is the pain?"  (Scale 1-10; or mild, moderate, severe)     no 8. OTHER SYMPTOMS: "Do you have any other symptoms?" (e.g., fever)     No fever 9. PREGNANCY: "Is there any chance you are pregnant?" "When was your last menstrual period?"     n/a  Answer Assessment - Initial Assessment Questions 1. ONSET: "When did the swelling start?" (e.g., minutes, hours, days)     Last night  2. LOCATION: "What part of the face is swollen?"     Under both eyes 3. SEVERITY: "How swollen is it?"     Mildly edematous, and red 4. ITCHING: "Is there any itching?" If so, ask: "How much?"   (Scale 1-10; mild, moderate or severe)     no 5. PAIN: "Is the swelling painful to touch?" If so, ask: "How painful is it?"   (Scale 1-10; mild, moderate or severe)     no 6. FEVER: "Do you have a fever?" If so, ask: "What is it, how was it measured, and when did it start?"      no 7.  CAUSE: "What do you think is causing the face swelling?"     Allergic reaction 8. RECURRENT SYMPTOM: "Have you had face swelling before?" If so, ask: "When was the last time?" "What happened that time?"     Unsure of last time during the summer pt unsure of the year 9. OTHER SYMPTOMS: "Do you have any other symptoms?" (e.g., toothache, leg swelling)     no 10. PREGNANCY: "Is there any chance you are pregnant?" "When was your last menstrual period?"       n/a  Answer Assessment - Initial Assessment Questions 1. ONSET: "When did the swelling start?" (e.g., minutes, hours, days)     Last night 2. LOCATION: "What part of the eyelids is swollen?"     Underneath bilateral eyes 3. SEVERITY: "How swollen is it?"     Mildly swollen 4. ITCHING: "Is there any itching?" If so, ask: "How much?"   (Scale 1-10; mild, moderate or severe)     No itching 5. PAIN: "Is the swelling painful to touch?" If so, ask: "How painful is it?"   (Scale 1-10; mild, moderate or severe)  no 6. FEVER: "Do you have a fever?" If so, ask: "What is it, how was it measured, and when did it start?"      no 7. CAUSE: "What do you think is causing the swelling?"     Allergic reaction? 8. RECURRENT SYMPTOM: "Have you had eyelid swelling before?" If so, ask: "When was the last time?" "What happened that time?"     One time before during this summer, Same thing happened usedice and Zyrtec 9. OTHER SYMPTOMS: "Do you have any other symptoms?" (e.g., blurred vision, eye discharge, rash, runny nose)     No blurred vision or eye discharge + red rash, No runny nose 10. PREGNANCY: "Is there any chance you are pregnant?" "When was your last menstrual period?"       n/a  Protocols used: RASH OR REDNESS - LOCALIZED-A-AH, EYE - Lar[facility] AngerSWELLING-A-AH, [facility]

## 2017-11-03 ENCOUNTER — Ambulatory Visit (INDEPENDENT_AMBULATORY_CARE_PROVIDER_SITE_OTHER): Payer: Managed Care, Other (non HMO) | Admitting: Family Medicine

## 2017-11-03 ENCOUNTER — Encounter: Payer: Self-pay | Admitting: Family Medicine

## 2017-11-03 VITALS — BP 120/70 | HR 92 | Temp 98.5°F | Wt 130.0 lb

## 2017-11-03 DIAGNOSIS — H6122 Impacted cerumen, left ear: Secondary | ICD-10-CM | POA: Diagnosis not present

## 2017-11-03 DIAGNOSIS — R0981 Nasal congestion: Secondary | ICD-10-CM | POA: Insufficient documentation

## 2017-11-03 DIAGNOSIS — H6120 Impacted cerumen, unspecified ear: Secondary | ICD-10-CM | POA: Insufficient documentation

## 2017-11-03 NOTE — Assessment & Plan Note (Addendum)
Anticipate viral given short duration. Supportive care reviewed. Red flags to seek further care reviewed. 

## 2017-11-03 NOTE — Patient Instructions (Signed)
You have left impacted ear - we will refer you to ENT for this. You have sinus inflammation - treat with ibuprofen 400mg  three times daily with meals for next 3 days.  Update us with how you're doing. Let us know if ongoing congestion, sinus pain past 7-10 days, fever >101 or worsening symptoms.

## 2017-11-03 NOTE — Progress Notes (Signed)
BP 120/70 (BP Location: Left Arm, Patient Position: Sitting, Cuff Size: Normal)   Pulse 92   Temp 98.5 F (36.9 C) (Oral)   Wt 130 lb (59 kg)   SpO2 99%   BMI 23.78 kg/m    CC: check ear "I think I was having swimmer's ear" Subjective:    Patient ID: [name], female    DOB: 03-Dec-1965, 52 y.o.   MRN: [id number]  HPI: [name] is a 52 y.o. female presenting on 11/03/2017 for Ear Fullness (Started yesterday with slight pain. Thinks she may have swimmer's ear) and Sinus Problem (Facial pressure, HA and puffiness around the eyes. Has taken Advil.)   2d ago had severe L earache that improved by placing cotton swab in ear. Now ear pain is better but both ears feel stopped up. This is associated with nasal sinus pain, swelling below eyes. Mild ST and rhinorrhea. Some headaches. Mild cough.   No fevers/chills, PNdrainage, or dyspnea.  No draining from ears. No tinnitus or hearing loss. No recent swimming.  Husband sick recently.  Has tried advil as well.  Non smoker, no h/o asthma.   Relevant past medical, surgical, family and social history reviewed and updated as indicated. Interim medical history since our last visit reviewed. Allergies and medications reviewed and updated. Outpatient Medications Prior to Visit  Medication Sig Dispense Refill  . aspirin 81 MG tablet Take 81 mg by mouth at bedtime.      . baclofen (LIORESAL) 10 MG tablet Take 0.5 tablets (5 mg total) by mouth every other day. (Patient taking differently: Take 5 mg by mouth every other day. Takes as needed) 30 each 0  . Cyanocobalamin (VITAMIN B 12 PO) Take 1,000 mcg by mouth daily.    [name] esomeprazole (NEXIUM) 20 MG capsule Take 20 mg by mouth every other day.     [name] ESTRACE VAGINAL 0.1 MG/GM vaginal cream INSERT 1 GRAM VGAINALLY 2 TIMES A WEEK  0  . meloxicam (MOBIC) 15 MG tablet Take 1 tablet (15 mg total) by mouth daily. (Patient taking differently: Take 15 mg by mouth daily. Takes as needed) 30  tablet 0  . nitrofurantoin, macrocrystal-monohydrate, (MACROBID) 100 MG capsule Take 1 capsule (100 mg total) by mouth 2 (two) times daily. 10 capsule 0   No facility-administered medications prior to visit.      Per HPI unless specifically indicated in ROS section below Review of Systems     Objective:    BP 120/70 (BP Location: Left Arm, Patient Position: Sitting, Cuff Size: Normal)   Pulse 92   Temp 98.5 F (36.9 C) (Oral)   Wt 130 lb (59 kg)   SpO2 99%   BMI 23.78 kg/m   Wt Readings from Last 3 Encounters:  11/03/17 130 lb (59 kg)  08/12/17 128 lb (58.1 kg)  07/23/17 127 lb 8 oz (57.8 kg)    Physical Exam  Constitutional: She appears well-developed and well-nourished. No distress.  HENT:  Head: Normocephalic and atraumatic.  Right Ear: Hearing, tympanic membrane, external ear and ear canal normal.  Left Ear: Hearing, external ear and ear canal normal.  Nose: Mucosal edema (nasal mucosal congestion) and rhinorrhea present. Right sinus exhibits maxillary sinus tenderness (and ethmoid). Right sinus exhibits no frontal sinus tenderness. Left sinus exhibits maxillary sinus tenderness (and ethmoid). Left sinus exhibits no frontal sinus tenderness.  Mouth/Throat: Uvula is midline, oropharynx is clear and moist and mucous membranes are normal. No oropharyngeal exudate, posterior oropharyngeal edema, posterior  oropharyngeal erythema or tonsillar abscesses.  L cerumen impaction - s/p unsuccessful irrigation  Eyes: Conjunctivae and EOM are normal. Pupils are equal, round, and reactive to light. No scleral icterus.  Neck: Normal range of motion. Neck supple.  Cardiovascular: Normal rate, regular rhythm and intact distal pulses.  Murmur (2/6 systolic) heard. Pulmonary/Chest: Effort normal and breath sounds normal. No respiratory distress. She has no wheezes. She has no rales.  Lymphadenopathy:    She has no cervical adenopathy.  Skin: Skin is warm and dry. No rash noted.  Nursing  note and vitals reviewed.  Results for orders placed or performed in visit on 08/24/17  [facility] MAMMOGRAPHY  Result Value Ref Range   HM Mammogram 0-4 Bi-Rad 0-4 Bi-Rad, Self Reported Normal      Assessment & Plan:   Problem List Items Addressed This Visit    Cerumen impaction - Primary    S/p unsuccessful irrigation today, ear pain with further attempts at disimpaction with plastic curette/alligator forceps. Will refer to ENT for ear cleaning - pt agrees with plan.       Relevant Orders   Ambulatory referral to ENT   Sinus congestion    Anticipate viral given short duration. Supportive care reviewed. Red flags to seek further care reviewed.           Follow up plan: No Follow-up on file.  Anna BoydenJavier Latonya Nelon, MD

## 2017-11-03 NOTE — Assessment & Plan Note (Addendum)
S/p unsuccessful irrigation today, ear pain with further attempts at disimpaction with plastic curette/alligator forceps. Will refer to ENT for ear cleaning - pt agrees with plan.

## 2017-11-05 ENCOUNTER — Telehealth: Payer: Self-pay | Admitting: Family Medicine

## 2017-11-05 NOTE — Telephone Encounter (Signed)
Patient called to [phone], left VM to call back to discuss why she needs a refill on Tussionex, the medication is not listed on her medication profile.

## 2017-11-05 NOTE — Telephone Encounter (Signed)
Copied from CRM [id number]. Topic: Quick Communication - Rx Refill/Question >> Nov 05, 2017 12:58 PM Viviann SpareWhite, Selina wrote: Medication: chlorpheniramine-HYDROcodone (TUSSIONEX PENNKINETIC ER) 10-8 MG/5ML LQCR   (PATIENT IS REQUESTING A CALL ONCE THE RX HAS BEEN SEND TO THE PHARMACY, PLEASE)  Has the patient contacted their pharmacy? Yes.    (Agent: If no, request that the patient contact the pharmacy for the refill.)  [id number] [place], [place] [facility] [address] [facility] [address] [place] [address] Phone: [phone] Fax: [phone]  [facility] (with phone number or street name):    Agent: Please be advised that RX refills may take up to 3 business days. We ask that you follow-up with your pharmacy.

## 2017-11-05 NOTE — Telephone Encounter (Signed)
Pt was seen by Dr. Reece AgarG on 11/03/17 for an OV. Pt now has complaints of cold like symptoms: runny and congested nose and dry cough that worsens at night. Pt states the symptoms started last night. Pt asking if she could be prescribed Tussionex for the cough because this is what she was given in the past by Dr. Dayton MartesAron. Pt states that over the counter medications do not work for her.   Pt also wanted to make Dr. Reece AgarG aware that she went to the ENT and the physician stated that he attributed her ear ache to coming down with a cold.

## 2017-11-05 NOTE — Telephone Encounter (Deleted)
Copied from CRM #[id number]. Topic: Quick Communication - Rx Refill/Question °>> Nov 05, 2017 12:58 PM White, Selina wrote: °Medication: chlorpheniramine-HYDROcodone (TUSSIONEX PENNKINETIC ER) 10-8 MG/5ML LQCR  ° °(PATIENT IS REQUESTING A CALL ONCE THE RX HAS BEEN SEND TO THE PHARMACY, PLEASE) ° °Has the patient contacted their pharmacy? Yes.   ° °(Agent: If no, request that the patient contact the pharmacy for the refill.) ° °[id number] [place], [place] - [address] °[address] [place] [address] °Phone: [phone] Fax: [phone] ° °[facility] (with phone number or street name):  ° ° °Agent: Please be advised that RX refills may take up to 3 business days. We ask that you follow-up with your pharmacy. ° °

## 2017-11-08 MED ORDER — HYDROCOD POLST-CPM POLST ER 10-8 MG/5ML PO SUER: 5.0000 mL | Freq: Every evening | ORAL | 0 refills | Status: DC | PRN

## 2017-11-08 NOTE — Telephone Encounter (Signed)
Left message on vm to call back.  Need to notify pt rx for tussionex was sent to the pharmacy.  Also, if productive cough worsens, she has a fever higher than 101 or other worsening symptoms, let us know.

## 2017-11-08 NOTE — Telephone Encounter (Addendum)
Sure plz notify that tussionex was sent in for her. Let us know if worsening productive cough, fever >101 or worsening symptoms. I'm glad ENT was able to clean out ear.

## 2017-11-08 NOTE — Addendum Note (Signed)
Addended by: Eustaquio BoydenGUTIERREZ, Burlene Montecalvo on: 11/08/2017 01:42 PM   Modules accepted: Orders

## 2017-11-09 ENCOUNTER — Telehealth: Payer: Self-pay | Admitting: Family Medicine

## 2017-11-09 ENCOUNTER — Telehealth: Payer: Self-pay | Admitting: *Deleted

## 2017-11-09 NOTE — Telephone Encounter (Signed)
Left message on vm per dpr relaying instructions per Dr. G. 

## 2017-11-09 NOTE — Telephone Encounter (Signed)
[facility] - Client TELEPHONE ADVICE RECORD [facility] Patient Name: [name] Gender: Female DOB: 1966-03-07 Age: [age] Y 5 M 7 D Return Phone Number: [phone] (Primary) Address: [place]Zip: [place] [address] Client [facility] - Client [facility] - Night Physician Kerby NoraBedsole, Amy - MD Contact Type Call Who Is Calling Patient / Member / Family / Caregiver Call Type Triage / Clinical Relationship To Patient Self Return Phone Number [phone] (Primary) Chief Complaint Medication Question (non symptomatic) Reason for Call Symptomatic / Request for Health Information Initial Comment Caller States she just picked up her cough syrup, would like to verify the directions. Translation No Nurse Assessment Nurse: Fransisco HertzKirkland, RN, Elnita Maxwellheryl Date/Time Lamount Cohen(Eastern Time): 11/08/2017 8:03:58 PM Confirm and document reason for call. If symptomatic, describe symptoms. ---Caller states she just picked up her cough syrup, would like to verify the directions. Caller states her Rx read to take 5ml of medication every night at bedtime as needed for cough. Does the patient have any new or worsening symptoms? ---Yes Will a triage be completed? ---Yes Related visit to physician within the last 2 weeks? ---Yes Does the PT have any chronic conditions? (i.e. diabetes, asthma, etc.) ---Unknown Is the patient pregnant or possibly pregnant? (Ask all females between the ages of 5353-55) ---No Is this a behavioral health or substance abuse call? ---No Guidelines Guideline Title Affirmed Question Affirmed Notes Nurse Date/Time Lamount Cohen(Eastern Time) Medication Question Call Caller has medication question, adult has minor symptoms, caller declines triage, and triager answers question Wendie ChessKirkland, RN, Cheryl 11/08/2017 8:08:07 PM Disp. Time Lamount Cohen(Eastern Time) Disposition Final User 11/08/2017 8:09:21 PM Home  Care: Information or Advice Only Call Yes Fransisco HertzKirkland, RN, Elnita Maxwellheryl PLEASE NOTE: All timestamps contained within this report are represented as [place] Standard Time. CONFIDENTIALTY NOTICE: This fax transmission is intended only for the addressee. It contains information that is legally privileged, confidential or otherwise protected from use or disclosure. If you are not the intended recipient, you are strictly prohibited from reviewing, disclosing, copying using or disseminating any of this information or taking any action in reliance on or regarding this information. If you have received this fax in error, please notify us immediately by telephone so that we can arrange for its return to us. Phone: [phone], Toll-Free: [phone], Fax: [phone] Page: 2 of 2 Call Id: [id number] Caller Disagree/Comply Comply Caller Understands Yes PreDisposition Did not know what to do Care Advice Given Per Guideline HOME CARE - INFORMATION OR ADVICE ONLY CALL. CARE ADVICE: Routine questions about OTC or prescription medications. The triager answered questions based on reference material or prior clinical experience. CARE ADVICE given per Medication Question Call (Adult) guideline. Comments User: Caryn Beeheryl, Kirkland, RN Date/Time (Eastern Time): 11/08/2017 8:09:55 PM Caller wanted to know what it meant to take a medication as needed for cough.

## 2017-11-09 NOTE — Telephone Encounter (Signed)
Pt. Called to ask about cough medicine - does she only take at night.

## 2017-11-09 NOTE — Telephone Encounter (Signed)
Would recommend take cough med at bedtime as needed for cough, would only take during day if she's going to stay at home - don't take and drive as it may make her sleepy. Max times per day she can take med is twice in 1 day.

## 2017-11-09 NOTE — Telephone Encounter (Signed)
Copied from CRM [id number]. Topic: Quick Communication - See Telephone Encounter >> Nov 08, 2017  5:29 PM Nanci PinaGoins, Felicia, CMA wrote: CRM for notification. See Telephone encounter for:   11/05/17.  [facility] Triage Nurse may relay message in 11/08/17 portion of encounter.

## 2017-11-10 NOTE — Telephone Encounter (Signed)
Left message on vm per dpr relaying instructions per Dr. G. 

## 2017-11-11 NOTE — Telephone Encounter (Signed)
Left message on vm per dpr relaying instructions per Dr. G. 

## 2017-12-10 ENCOUNTER — Encounter: Payer: Self-pay | Admitting: Family Medicine

## 2017-12-10 LAB — HM MAMMOGRAPHY

## 2017-12-10 LAB — HM DEXA SCAN

## 2017-12-27 ENCOUNTER — Encounter: Payer: Self-pay | Admitting: Family Medicine

## 2017-12-27 ENCOUNTER — Telehealth: Payer: Self-pay | Admitting: *Deleted

## 2017-12-27 ENCOUNTER — Encounter: Payer: Self-pay | Admitting: *Deleted

## 2017-12-27 DIAGNOSIS — M85851 Other specified disorders of bone density and structure, right thigh: Secondary | ICD-10-CM | POA: Insufficient documentation

## 2017-12-27 NOTE — Telephone Encounter (Signed)
Left message for Mrs. [name] that her bone density report showed that she has osteopenia of her right hip.  Dr. Mirian MoBesdole recommends getting plenty of calcium in her diet, take a Vit D supplement and do weight bearing exercises.  We will plan on repeating Dexa Scan in 2 to 5 years.  I ask that she call back if she has any questions.

## 2018-02-03 ENCOUNTER — Telehealth: Payer: Self-pay

## 2018-02-03 MED ORDER — EPINEPHRINE 0.3 MG/0.3ML IJ SOAJ: 0.3000 mg | Freq: Once | INTRAMUSCULAR | 0 refills | Status: AC

## 2018-02-03 NOTE — Telephone Encounter (Signed)
Copied from CRM [id number]. Topic: General - Other >> Feb 03, 2018  1:10 PM Darletta MollLander, Lumin L wrote: Reason for CRM: Patient would like a call back from Dr. Albertina SenegalBedsoles nurse  about her calcium levels in relation to her bone density test done in March as well as a question about measles.

## 2018-02-03 NOTE — Telephone Encounter (Signed)
Spoke with [name].  She states a one point Dr. Diona Browner told her there was a certain type of calcium she should take since she is on Nexium.  Please advise which kind and dosage [name] should take.    Also she saw on [employer] that a MD said that if you received your MMR vaccine between [phone] that you may not be immune anymore.  Patient is concerned with all the measle outbreaks.  Should she get the titer to see if she is still immune or get another MMR vaccine.  Please advise.

## 2018-02-04 NOTE — Telephone Encounter (Signed)
Best way to get calcium is in diet but..If  Inadequate dietary intake...recommend calcium citrate in patients taking proton pump inhibitors likely nexium.   As far as the MMR... Either one additional MMR or getting the titer.. Either would be okay. Maybe check on cost with insurance. I could not find any recommendation one way or the other on CDC website.

## 2018-02-04 NOTE — Telephone Encounter (Signed)
Left detailed message for patient with information below from Dr. Ermalene SearingBedsole.

## 2018-08-11 ENCOUNTER — Ambulatory Visit: Payer: Managed Care, Other (non HMO)

## 2018-08-12 LAB — HM PAP SMEAR: HM Pap smear: NEGATIVE

## 2018-08-30 ENCOUNTER — Encounter: Payer: Self-pay | Admitting: Family Medicine

## 2018-08-30 ENCOUNTER — Ambulatory Visit (INDEPENDENT_AMBULATORY_CARE_PROVIDER_SITE_OTHER): Payer: Managed Care, Other (non HMO) | Admitting: Family Medicine

## 2018-08-30 VITALS — BP 110/72 | HR 87 | Temp 98.8°F | Wt 133.5 lb

## 2018-08-30 DIAGNOSIS — Z20828 Contact with and (suspected) exposure to other viral communicable diseases: Secondary | ICD-10-CM

## 2018-08-30 DIAGNOSIS — Z13220 Encounter for screening for lipoid disorders: Secondary | ICD-10-CM | POA: Diagnosis not present

## 2018-08-30 DIAGNOSIS — E221 Hyperprolactinemia: Secondary | ICD-10-CM

## 2018-08-30 DIAGNOSIS — Z0000 Encounter for general adult medical examination without abnormal findings: Secondary | ICD-10-CM | POA: Diagnosis not present

## 2018-08-30 LAB — COMPREHENSIVE METABOLIC PANEL
ALT: 16 U/L (ref 0–35)
AST: 17 U/L (ref 0–37)
Albumin: 4.7 g/dL (ref 3.5–5.2)
Alkaline Phosphatase: 60 U/L (ref 39–117)
BUN: 13 mg/dL (ref 6–23)
CO2: 28 mEq/L (ref 19–32)
Calcium: 9.7 mg/dL (ref 8.4–10.5)
Chloride: 106 mEq/L (ref 96–112)
Creatinine, Ser: 0.6 mg/dL (ref 0.40–1.20)
GFR: 111.04 mL/min (ref >60.00)
Glucose, Bld: 105 mg/dL — ABNORMAL HIGH (ref 70–99)
Potassium: 4.3 mEq/L (ref 3.5–5.1)
Sodium: 142 mEq/L (ref 135–145)
Total Bilirubin: 0.5 mg/dL (ref 0.2–1.2)
Total Protein: 7.1 g/dL (ref 6.0–8.3)

## 2018-08-30 LAB — LIPID PANEL
Cholesterol: 206 mg/dL — ABNORMAL HIGH (ref 0–200)
HDL: 80.5 mg/dL (ref >39.00)
LDL Cholesterol: 114 mg/dL — ABNORMAL HIGH (ref 0–99)
NonHDL: 125.66
Total CHOL/HDL Ratio: 3
Triglycerides: 56 mg/dL (ref 0.0–149.0)
VLDL: 11.2 mg/dL (ref 0.0–40.0)

## 2018-08-30 NOTE — Progress Notes (Signed)
Established Patient Office Visit  Subjective:  Patient ID: [name], female    DOB: 1966-07-25  Age: 52 y.o. MRN: [id number]  CC:  Chief Complaint  Patient presents with  . Annual Exam    HPI The patient is here for annual wellness exam and preventative care.    Hyperprolactinemia:  Need yearly check.   Migraine rare occurence  Diet: Less red meat, bacon. Exercise: Minimal in last year.  Labs  Ordered for pt, fasting.  Past Medical History:  Diagnosis Date  . Chest pain   . Congenital anomalies of pancreas   . Esophageal spasm   . GERD (gastroesophageal reflux disease)     Past Surgical History:  Procedure Laterality Date  . APPENDECTOMY    . OOPHORECTOMY     Right  . ROTATOR CUFF REPAIR      Family History  Problem Relation Age of Onset  . Emphysema Paternal Grandfather   . Allergies Mother   . Asthma Mother   . Hypertension Mother   . Heart disease Maternal Grandmother   . Lymphoma Maternal Grandfather   . Breast cancer Maternal Aunt   . Hyperlipidemia Father   . Heart disease Father 34  . Hypothyroidism Sister 25       post surgical    Social History   Socioeconomic History  . Marital status: Married    Spouse name: Not on file  . Number of children: 0  . Years of education: Not on file  . Highest education level: Not on file  Occupational History  . Not on file  Social Needs  . Financial resource strain: Not on file  . Food insecurity:    Worry: Not on file    Inability: Not on file  . Transportation needs:    Medical: Not on file    Non-medical: Not on file  Tobacco Use  . Smoking status: Never Smoker  . Smokeless tobacco: Never Used  Substance and Sexual Activity  . Alcohol use: No  . Drug use: No  . Sexual activity: Not on file  Lifestyle  . Physical activity:    Days per week: Not on file    Minutes per session: Not on file  . Stress: Not on file  Relationships  . Social connections:    Talks on phone: Not on  file    Gets together: Not on file    Attends religious service: Not on file    Active member of club or organization: Not on file    Attends meetings of clubs or organizations: Not on file    Relationship status: Not on file  . Intimate partner violence:    Fear of current or ex partner: Not on file    Emotionally abused: Not on file    Physically abused: Not on file    Forced sexual activity: Not on file  Other Topics Concern  . Not on file  Social History Narrative  . Not on file    Outpatient Medications Prior to Visit  Medication Sig Dispense Refill  . aspirin 81 MG tablet Take 81 mg by mouth at bedtime.      . baclofen (LIORESAL) 10 MG tablet Take 0.5 tablets (5 mg total) by mouth every other day. (Patient taking differently: Take 5 mg by mouth every other day. Takes as needed) 30 each 0  . Cyanocobalamin (VITAMIN B 12 PO) Take 1,000 mcg by mouth daily.    [name] esomeprazole (NEXIUM) 20 MG capsule Take  20 mg by mouth every other day.     . chlorpheniramine-HYDROcodone (TUSSIONEX PENNKINETIC ER) 10-8 MG/5ML SUER Take 5 mLs by mouth at bedtime as needed for cough. 120 mL 0  . ESTRACE VAGINAL 0.1 MG/GM vaginal cream INSERT 1 GRAM VGAINALLY 2 TIMES A WEEK  0  . meloxicam (MOBIC) 15 MG tablet Take 1 tablet (15 mg total) by mouth daily. (Patient taking differently: Take 15 mg by mouth daily. Takes as needed) 30 tablet 0   No facility-administered medications prior to visit.     Allergies  Allergen Reactions  . Other     Walnut and pecans  . Sulfonamide Derivatives     ROS Review of Systems  Constitutional: Negative for fatigue and fever.  HENT: Negative for congestion.   Eyes: Negative for pain.  Respiratory: Negative for cough and shortness of breath.   Cardiovascular: Negative for chest pain, palpitations and leg swelling.  Gastrointestinal: Negative for abdominal pain.  Genitourinary: Negative for dysuria and vaginal bleeding.  Musculoskeletal: Negative for back pain.    Neurological: Negative for syncope, light-headedness and headaches.  Psychiatric/Behavioral: Negative for dysphoric mood.      Objective:    Physical Exam  Constitutional: Vital signs are normal. She appears well-developed and well-nourished. She is cooperative.  Non-toxic appearance. She does not appear ill. No distress.  HENT:  Head: Normocephalic.  Right Ear: Hearing, tympanic membrane, external ear and ear canal normal. Tympanic membrane is not erythematous, not retracted and not bulging.  Left Ear: Hearing, tympanic membrane, external ear and ear canal normal. Tympanic membrane is not erythematous, not retracted and not bulging.  Nose: No mucosal edema or rhinorrhea. Right sinus exhibits no maxillary sinus tenderness and no frontal sinus tenderness. Left sinus exhibits no maxillary sinus tenderness and no frontal sinus tenderness.  Mouth/Throat: Uvula is midline, oropharynx is clear and moist and mucous membranes are normal.  Eyes: Pupils are equal, round, and reactive to light. Conjunctivae, EOM and lids are normal. Lids are everted and swept, no foreign bodies found.  Neck: Trachea normal and normal range of motion. Neck supple. Carotid bruit is not present. No thyroid mass and no thyromegaly present.  Cardiovascular: Normal rate, regular rhythm, S1 normal, S2 normal, normal heart sounds, intact distal pulses and normal pulses. Exam reveals no gallop and no friction rub.  No murmur heard. Pulmonary/Chest: Effort normal and breath sounds normal. No tachypnea. No respiratory distress. She has no decreased breath sounds. She has no wheezes. She has no rhonchi. She has no rales.  Abdominal: Soft. Normal appearance and bowel sounds are normal. There is no tenderness.  Neurological: She is alert.  Skin: Skin is warm, dry and intact. No rash noted.  Psychiatric: Her speech is normal and behavior is normal. Judgment and thought content normal. Her mood appears not anxious. Cognition and  memory are normal. She does not exhibit a depressed mood.    Pulse 87   Temp 98.8 F (37.1 C) (Oral)   Wt 133 lb 8 oz (60.6 kg)   BMI 24.42 kg/m  Wt Readings from Last 3 Encounters:  08/30/18 133 lb 8 oz (60.6 kg)  11/03/17 130 lb (59 kg)  08/12/17 128 lb (58.1 kg)     Health Maintenance Due  Topic Date Due  . HIV Screening  06/02/1980  . INFLUENZA VACCINE  05/12/2018  . PAP SMEAR  07/02/2018  . MAMMOGRAM  08/18/2018    There are no preventive care reminders to display for this patient.  Lab  Results  Component Value Date   TSH 0.36 02/20/2011   Lab Results  Component Value Date   WBC 4.7 02/20/2011   HGB 11.8 (L) 02/20/2011   HCT 34.6 (L) 02/20/2011   MCV 85.7 02/20/2011   PLT 315.0 02/20/2011   Lab Results  Component Value Date   NA 143 08/09/2017   K 4.5 08/09/2017   CO2 29 08/09/2017   GLUCOSE 102 (H) 08/09/2017   BUN 8 08/09/2017   CREATININE 0.52 08/09/2017   BILITOT 0.4 08/09/2017   ALKPHOS 57 08/09/2017   AST 14 08/09/2017   ALT 12 08/09/2017   PROT 6.9 08/09/2017   ALBUMIN 4.5 08/09/2017   CALCIUM 9.6 08/09/2017   GFR 131.52 08/09/2017   Lab Results  Component Value Date   CHOL 183 08/09/2017   Lab Results  Component Value Date   HDL 79.10 08/09/2017   Lab Results  Component Value Date   LDLCALC 91 08/09/2017   Lab Results  Component Value Date   TRIG 68.0 08/09/2017   Lab Results  Component Value Date   CHOLHDL 2 08/09/2017    The patient's preventative maintenance and recommended screening tests for an annual wellness exam were reviewed in full today. Brought up to date unless services declined.  Counselled on the importance of diet, exercise, and its role in overall health and mortality. The patient's FH and SH was reviewed, including their home life, tobacco status, and drug and alcohol status.   Vaccines: uptodate, had flu shot at GYN Sees GYN , nml pap, neg HPV 2019 COLON: nml 06/2015 repeat in 10 years. Mammo: nml  11/2016, has done this year and will get records from GYN. Refused HIV testing  nonsmoker  ETOH/ Drug;l none/none   Kerby Nora, MD

## 2018-08-30 NOTE — Patient Instructions (Addendum)
Please stop at the lab to have labs drawn.  Get back to regular exercise 150 min per week.

## 2018-08-31 LAB — PROLACTIN: Prolactin: 11.4 ng/mL

## 2018-08-31 LAB — MEASLES/MUMPS/RUBELLA IMMUNITY
Mumps IgG: >300 AU/mL
Rubella: 9.15 index
Rubeola IgG: >300 AU/mL

## 2018-09-06 ENCOUNTER — Encounter: Payer: Self-pay | Admitting: Family Medicine

## 2018-12-20 LAB — HM MAMMOGRAPHY

## 2018-12-27 ENCOUNTER — Encounter: Payer: Self-pay | Admitting: Family Medicine

## 2019-06-01 ENCOUNTER — Telehealth: Payer: Self-pay

## 2019-06-01 MED ORDER — DICLOFENAC SODIUM 75 MG PO TBEC: 75.0000 mg | DELAYED_RELEASE_TABLET | ORAL | 0 refills | Status: DC | PRN

## 2019-06-01 NOTE — Telephone Encounter (Signed)
Pt request refill diclofenac; pt picked up her 50 lb dog on 05/27/19; pt not supposed to lift that much but pt did not think she had a choice. Since lifting the dog she has pain rt lower back; pain does not radiate anywhere. No leg or arm weakness, no UTI symptoms, Pt has no covid symptoms, no travel and no known exposure to + covid. Please advise. [facility]

## 2019-06-01 NOTE — Telephone Encounter (Signed)
Diclofenac refilled but if  Back pain not improving in 3-4 days.. she needs appt to be seen.

## 2019-06-01 NOTE — Telephone Encounter (Signed)
Patient advised.

## 2019-07-20 ENCOUNTER — Ambulatory Visit (INDEPENDENT_AMBULATORY_CARE_PROVIDER_SITE_OTHER): Payer: Managed Care, Other (non HMO)

## 2019-07-20 DIAGNOSIS — Z23 Encounter for immunization: Secondary | ICD-10-CM

## 2019-09-01 ENCOUNTER — Encounter: Payer: Self-pay | Admitting: Family Medicine

## 2019-09-01 ENCOUNTER — Ambulatory Visit (INDEPENDENT_AMBULATORY_CARE_PROVIDER_SITE_OTHER): Payer: Managed Care, Other (non HMO) | Admitting: Family Medicine

## 2019-09-01 ENCOUNTER — Encounter: Payer: Managed Care, Other (non HMO) | Admitting: Family Medicine

## 2019-09-01 ENCOUNTER — Other Ambulatory Visit: Payer: Self-pay

## 2019-09-01 ENCOUNTER — Telehealth: Payer: Self-pay | Admitting: *Deleted

## 2019-09-01 VITALS — BP 100/70 | HR 73 | Temp 97.6°F | Ht 62.0 in | Wt 129.5 lb

## 2019-09-01 DIAGNOSIS — Z13220 Encounter for screening for lipoid disorders: Secondary | ICD-10-CM

## 2019-09-01 DIAGNOSIS — E221 Hyperprolactinemia: Secondary | ICD-10-CM | POA: Diagnosis not present

## 2019-09-01 DIAGNOSIS — Z0000 Encounter for general adult medical examination without abnormal findings: Secondary | ICD-10-CM

## 2019-09-01 LAB — LIPID PANEL
Cholesterol: 208 mg/dL — ABNORMAL HIGH (ref 0–200)
HDL: 84.2 mg/dL (ref >39.00)
LDL Cholesterol: 114 mg/dL — ABNORMAL HIGH (ref 0–99)
NonHDL: 124.24
Total CHOL/HDL Ratio: 2
Triglycerides: 49 mg/dL (ref 0.0–149.0)
VLDL: 9.8 mg/dL (ref 0.0–40.0)

## 2019-09-01 LAB — COMPREHENSIVE METABOLIC PANEL
ALT: 13 U/L (ref 0–35)
AST: 13 U/L (ref 0–37)
Albumin: 4.9 g/dL (ref 3.5–5.2)
Alkaline Phosphatase: 63 U/L (ref 39–117)
BUN: 8 mg/dL (ref 6–23)
CO2: 30 mEq/L (ref 19–32)
Calcium: 9.7 mg/dL (ref 8.4–10.5)
Chloride: 103 mEq/L (ref 96–112)
Creatinine, Ser: 0.62 mg/dL (ref 0.40–1.20)
GFR: 100.22 mL/min (ref >60.00)
Glucose, Bld: 100 mg/dL — ABNORMAL HIGH (ref 70–99)
Potassium: 4.8 mEq/L (ref 3.5–5.1)
Sodium: 141 mEq/L (ref 135–145)
Total Bilirubin: 0.5 mg/dL (ref 0.2–1.2)
Total Protein: 7.3 g/dL (ref 6.0–8.3)

## 2019-09-01 MED ORDER — EPINEPHRINE 0.3 MG/0.3ML IJ SOAJ: 0.3000 mg | INTRAMUSCULAR | 0 refills | Status: DC | PRN

## 2019-09-01 NOTE — Patient Instructions (Signed)
Continue working on exercise and  healthy eating habits.

## 2019-09-01 NOTE — Telephone Encounter (Signed)
[facility] Pen.  PA completed on CoverMyMeds and sent to [employer] for review.  Can take up to 3 business days to get a decision.

## 2019-09-01 NOTE — Progress Notes (Signed)
Chief Complaint  Patient presents with  . Annual Exam    History of Present Illness: HPI  The patient is here for annual wellness exam and preventative care.    Hyperprolactinemia:  Had elevation 15-20 years. MRI brain normal.  Migraine:  Rare occurrence.   Diet:healthy diet.  Exercise: walking 3-4 times a week   COVID 19 screen No recent travel or known exposure to [place] The patient denies respiratory symptoms of COVID 19 at this time.  The importance of social distancing was discussed today.   Review of Systems  Constitutional: Negative for chills and fever.  HENT: Negative for congestion and ear pain.   Eyes: Negative for pain and redness.  Respiratory: Negative for cough and shortness of breath.   Cardiovascular: Negative for chest pain, palpitations and leg swelling.  Gastrointestinal: Negative for abdominal pain, blood in stool, constipation, diarrhea, nausea and vomiting.  Genitourinary: Negative for dysuria.  Musculoskeletal: Negative for falls and myalgias.  Skin: Negative for rash.  Neurological: Negative for dizziness.  Psychiatric/Behavioral: Negative for depression. The patient is not nervous/anxious.       Past Medical History:  Diagnosis Date  . Chest pain   . Congenital anomalies of pancreas   . Esophageal spasm   . GERD (gastroesophageal reflux disease)     reports that she has never smoked. She has never used smokeless tobacco. She reports that she does not drink alcohol or use drugs.   Current Outpatient Medications:  .  aspirin 81 MG tablet, Take 81 mg by mouth at bedtime.  , Disp: , Rfl:  .  baclofen (LIORESAL) 10 MG tablet, Take 5 mg by mouth every other day., Disp: , Rfl:  .  Cyanocobalamin (VITAMIN B 12 PO), Take 1,000 mcg by mouth daily., Disp: , Rfl:  .  diclofenac (VOLTAREN) 75 MG EC tablet, Take 1 tablet (75 mg total) by mouth as needed., Disp: 30 tablet, Rfl: 0 .  esomeprazole (NEXIUM) 20 MG capsule, Take 20 mg by mouth every other  day., Disp: , Rfl:    Observations/Objective: Blood pressure 100/70, pulse 73, temperature 97.6 F (36.4 C), temperature source Temporal, height 5\' 2"  (1.575 m), weight 129 lb 8 oz (58.7 kg), SpO2 100 %.  Physical Exam Constitutional:      General: She is not in acute distress.    Appearance: Normal appearance. She is well-developed. She is not ill-appearing or toxic-appearing.  HENT:     Head: Normocephalic.     Right Ear: Hearing, tympanic membrane, ear canal and external ear normal.     Left Ear: Hearing, tympanic membrane, ear canal and external ear normal.     Nose: Nose normal.  Eyes:     General: Lids are normal. Lids are everted, no foreign bodies appreciated.     Conjunctiva/sclera: Conjunctivae normal.     Pupils: Pupils are equal, round, and reactive to light.  Neck:     Musculoskeletal: Normal range of motion and neck supple.     Thyroid: No thyroid mass or thyromegaly.     Vascular: No carotid bruit.     Trachea: Trachea normal.  Cardiovascular:     Rate and Rhythm: Normal rate and regular rhythm.     Heart sounds: Normal heart sounds, S1 normal and S2 normal. No murmur. No gallop.   Pulmonary:     Effort: Pulmonary effort is normal. No respiratory distress.     Breath sounds: Normal breath sounds. No wheezing, rhonchi or rales.  Abdominal:  General: Bowel sounds are normal. There is no distension or abdominal bruit.     Palpations: Abdomen is soft. There is no fluid wave or mass.     Tenderness: There is no abdominal tenderness. There is no guarding or rebound.     Hernia: No hernia is present.  Lymphadenopathy:     Cervical: No cervical adenopathy.  Skin:    General: Skin is warm and dry.     Findings: No rash.  Neurological:     Mental Status: She is alert.     Cranial Nerves: No cranial nerve deficit.     Sensory: No sensory deficit.  Psychiatric:        Mood and Affect: Mood is not anxious or depressed.        Speech: Speech normal.        Behavior:  Behavior normal. Behavior is cooperative.        Judgment: Judgment normal.      Assessment and Plan   The patient's preventative maintenance and recommended screening tests for an annual wellness exam were reviewed in full today. Brought up to date unless services declined.  Counselled on the importance of diet, exercise, and its role in overall health and mortality. The patient's FH and SH was reviewed, including their home life, tobacco status, and drug and alcohol status.   Vaccines:uptodate, had flu shot at GYN, Shingles vaccine Sees GYN , nml pap, neg HPV 2019 COLON: nml 06/2015 repeat in 10 years. Mammo: nml 12/2018 Refused HIV testing nonsmoker  ETOH/ Drug none/none  Walnut allergy.. rx for epi pen given  Kerby Nora, MD

## 2019-09-02 LAB — PROLACTIN: Prolactin: 10.6 ng/mL

## 2019-09-04 NOTE — Telephone Encounter (Signed)
Noted. Generic is fine.

## 2019-09-04 NOTE — Telephone Encounter (Signed)
PA denied.  States doctor must provide information explaining why the generic epinephrine injection are not appropriate for your condition.   I [facility] with [facility] Low[facility] to see if she ran the Rx through as a generic if they would covered it.  She did and states that her insurance will cover only a particular manufacturer and her co pay would still be $286.  I left message for Karis explaining this and advised if we could do anything else on our end, to please let me know.  FYI to Dr. Diona Browner.

## 2019-10-25 ENCOUNTER — Ambulatory Visit: Payer: Managed Care, Other (non HMO) | Attending: Internal Medicine

## 2019-10-25 DIAGNOSIS — Z23 Encounter for immunization: Secondary | ICD-10-CM | POA: Diagnosis not present

## 2019-10-25 NOTE — Progress Notes (Addendum)
   Covid-19 Vaccination Clinic  Name:  [name]    MRN: [id number] DOB: 1966-06-13  10/25/2019  [name] was observed post Covid-19 immunization for 30 minutes without incidence. She was provided with Vaccine Information Sheet and instruction to access the V-Safe system.   [name] was instructed to call 911 with any severe reactions post vaccine: [name] Difficulty breathing  . Swelling of your face and throat  . A fast heartbeat  . A bad rash all over your body  . Dizziness and weakness    Immunizations Administered    Name Date Dose VIS Date Route   Pfizer COVID-19 Vaccine 10/25/2019 10:55 AM 0.3 mL 09/22/2019 Intramuscular   Manufacturer: [employer], [employer]   Lot: [id number]   NDC: [address]-7

## 2019-11-14 ENCOUNTER — Ambulatory Visit: Payer: Managed Care, Other (non HMO) | Attending: Internal Medicine

## 2019-11-14 DIAGNOSIS — Z23 Encounter for immunization: Secondary | ICD-10-CM | POA: Insufficient documentation

## 2019-11-14 NOTE — Progress Notes (Signed)
   Covid-19 Vaccination Clinic  Name:  Cleota Pellerito    MRN: [id number] DOB: 10/11/1966  11/14/2019  Ms. Stanwood was observed post Covid-19 immunization for 30 minutes based on pre-vaccination screening without incidence. She was provided with Vaccine Information Sheet and instruction to access the V-Safe system.   Ms. Urquidi was instructed to call 911 with any severe reactions post vaccine: [name] Difficulty breathing  . Swelling of your face and throat  . A fast heartbeat  . A bad rash all over your body  . Dizziness and weakness    Immunizations Administered    Name Date Dose VIS Date Route   Pfizer COVID-19 Vaccine 11/14/2019 10:02 AM 0.3 mL 09/22/2019 Intramuscular   Manufacturer: [employer], [employer]   Lot: [id number]   NDC: [address]-4

## 2019-12-22 LAB — HM MAMMOGRAPHY

## 2019-12-27 ENCOUNTER — Encounter: Payer: Self-pay | Admitting: Family Medicine

## 2020-03-27 ENCOUNTER — Telehealth: Payer: Self-pay | Admitting: Family Medicine

## 2020-03-27 NOTE — Telephone Encounter (Signed)
Pt would like to get Shingrix vaccine. She said she has verified with insurance that it is ok to get in the doctor's office. Is this ok to schedule?

## 2020-03-28 NOTE — Telephone Encounter (Signed)
lvm for pt to call back and schedule Shigrix

## 2020-03-28 NOTE — Telephone Encounter (Signed)
Yes okay to schedule.

## 2020-04-03 ENCOUNTER — Ambulatory Visit: Payer: Managed Care, Other (non HMO)

## 2020-04-30 ENCOUNTER — Other Ambulatory Visit: Payer: Self-pay

## 2020-04-30 ENCOUNTER — Ambulatory Visit (INDEPENDENT_AMBULATORY_CARE_PROVIDER_SITE_OTHER): Payer: Managed Care, Other (non HMO)

## 2020-04-30 DIAGNOSIS — Z23 Encounter for immunization: Secondary | ICD-10-CM | POA: Diagnosis not present

## 2020-05-03 ENCOUNTER — Other Ambulatory Visit: Payer: Self-pay

## 2020-05-03 ENCOUNTER — Telehealth (INDEPENDENT_AMBULATORY_CARE_PROVIDER_SITE_OTHER): Payer: Managed Care, Other (non HMO) | Admitting: Family Medicine

## 2020-05-03 ENCOUNTER — Encounter: Payer: Self-pay | Admitting: Family Medicine

## 2020-05-03 VITALS — Ht 62.0 in

## 2020-05-03 DIAGNOSIS — J0100 Acute maxillary sinusitis, unspecified: Secondary | ICD-10-CM | POA: Diagnosis not present

## 2020-05-03 MED ORDER — AMOXICILLIN 500 MG PO CAPS: 1000.0000 mg | ORAL_CAPSULE | Freq: Two times a day (BID) | ORAL | 0 refills | Status: DC

## 2020-05-03 NOTE — Patient Instructions (Addendum)
First try flonase 2 prays per nostril daily.  if not better in 2-3 days, stop Zyrtec and try Xyzal at bedtime in addition to flonase.   If fever, unilateral  face pain, copious green  nasal discharge or not improving after 4-5 days... start antibiotics.  Meds ordered this encounter  Medications  . amoxicillin (AMOXIL) 500 MG capsule    Sig: Take 2 capsules (1,000 mg total) by mouth 2 (two) times daily. Fill if not improving after 3-4 days, VOID after 06/03/2020.    Dispense:  40 capsule    Refill:  0    Have COVID testing if cough,  shortness of breath and fever.

## 2020-05-03 NOTE — Assessment & Plan Note (Signed)
Liekly allergic.  First try flonase 2 prays per nostril daily.  if not better in 2-3 days, stop Zyrtec and try Xyzal at bedtime in addition to flonase.   If fever, unilateral  face pain, copious green  nasal discharge or not improving after 4-5 days... start antibiotics.  Meds ordered this encounter  Medications  . amoxicillin (AMOXIL) 500 MG capsule    Sig: Take 2 capsules (1,000 mg total) by mouth 2 (two) times daily. Fill if not improving after 3-4 days, VOID after 06/03/2020.    Dispense:  40 capsule    Refill:  0    Have COVID testing if cough,  shortness of brea

## 2020-05-03 NOTE — Progress Notes (Signed)
VIRTUAL VISIT Due to national recommendations of social distancing due to COVID 19, a virtual visit is felt to be most appropriate for this patient at this time.   I connected with the patient on 05/03/20 at  9:00 AM EDT by virtual telehealth platform and verified that I am speaking with the correct person using two identifiers.   I discussed the limitations, risks, security and privacy concerns of performing an evaluation and management service by  virtual telehealth platform and the availability of in person appointments. I also discussed with the patient that there may be a patient responsible charge related to this service. The patient expressed understanding and agreed to proceed.  Patient location: Home Provider Location: [facility] Participants: Roch Quach Ermalene Searing and Milton Ferguson   Chief Complaint  Patient presents with  . Sinus Drainage  . Facial Swellig    and pressue under eyes    History of Present Illness: Sinus Problem This is a new problem. The current episode started 1 to 4 weeks ago (2 weeks). The problem has been waxing and waning since onset. There has been no fever. Her pain is at a severity of 6/10. The pain is moderate. Associated symptoms include congestion and sinus pressure. Pertinent negatives include no chills, coughing, ear pain, shortness of breath, sneezing, sore throat or swollen glands. (Post nasal drip Started with congestion  eyes puffy swollen and pressure under eyes) Treatments tried: using zyrtec daily, ibuprofen prn. The treatment provided mild relief.    No antibiotics recently.   Immunization status: COVID19 up to date and documented.    COVID 19 screen No recent travel or known exposure to COVID19 The patient denies respiratory symptoms of COVID 19 at this time.  The importance of social distancing was discussed today.   Review of Systems  Constitutional: Negative for chills.  HENT: Positive for congestion and sinus pressure.  Negative for ear pain, sneezing and sore throat.   Respiratory: Negative for cough and shortness of breath.       Past Medical History:  Diagnosis Date  . Chest pain   . Congenital anomalies of pancreas   . Esophageal spasm   . GERD (gastroesophageal reflux disease)     reports that she has never smoked. She has never used smokeless tobacco. She reports that she does not drink alcohol and does not use drugs.   Current Outpatient Medications:  .  aspirin 81 MG tablet, Take 81 mg by mouth at bedtime.  , Disp: , Rfl:  .  baclofen (LIORESAL) 10 MG tablet, Take 5 mg by mouth every other day., Disp: , Rfl:  .  Cholecalciferol (VITAMIN D) 50 MCG (2000 UT) tablet, Take 2,000 Units by mouth daily., Disp: , Rfl:  .  Cyanocobalamin (VITAMIN B 12 PO), Take 1,000 mcg by mouth daily., Disp: , Rfl:  .  diclofenac (VOLTAREN) 75 MG EC tablet, Take 1 tablet (75 mg total) by mouth as needed., Disp: 30 tablet, Rfl: 0 .  EPINEPHrine 0.3 mg/0.3 mL IJ SOAJ injection, Inject 0.3 mLs (0.3 mg total) into the muscle as needed for anaphylaxis., Disp: 2 each, Rfl: 0 .  esomeprazole (NEXIUM) 20 MG capsule, Take 20 mg by mouth every other day., Disp: , Rfl:    Observations/Objective: Height 5\' 2"  (1.575 m).  Physical Exam  Physical Exam Constitutional:      General: The patient is not in acute distress. Pulmonary:     Effort: Pulmonary effort is normal. No respiratory distress.  Neurological:  Mental Status: The patient is alert and oriented to person, place, and time.  Psychiatric:        Mood and Affect: Mood normal.        Behavior: Behavior normal.   Assessment and Plan Acute non-recurrent maxillary sinusitis Liekly allergic.  First try flonase 2 prays per nostril daily.  if not better in 2-3 days, stop Zyrtec and try Xyzal at bedtime in addition to flonase.   If fever, unilateral  face pain, copious green  nasal discharge or not improving after 4-5 days... start antibiotics.  Meds ordered this  encounter  Medications  . amoxicillin (AMOXIL) 500 MG capsule    Sig: Take 2 capsules (1,000 mg total) by mouth 2 (two) times daily. Fill if not improving after 3-4 days, VOID after 06/03/2020.    Dispense:  40 capsule    Refill:  0    Have COVID testing if cough,  shortness of brea     I discussed the assessment and treatment plan with the patient. The patient was provided an opportunity to ask questions and all were answered. The patient agreed with the plan and demonstrated an understanding of the instructions.   The patient was advised to call back or seek an in-person evaluation if the symptoms worsen or if the condition fails to improve as anticipated.     Kerby Nora, MD

## 2020-05-07 LAB — HM DEXA SCAN

## 2020-05-09 ENCOUNTER — Encounter: Payer: Self-pay | Admitting: Family Medicine

## 2020-05-15 ENCOUNTER — Encounter: Payer: Self-pay | Admitting: Family Medicine

## 2020-05-23 ENCOUNTER — Encounter: Payer: Self-pay | Admitting: Genetic Counselor

## 2020-07-09 ENCOUNTER — Telehealth: Payer: Self-pay | Admitting: *Deleted

## 2020-07-09 NOTE — Telephone Encounter (Signed)
Patient left a voicemail stating that she has some diclofenac pill left over from a visit last 8/20. Patient stated that the bottle shows that they should be discarded 05/2020. Patient stated that she has pulled her back and wants to know if it is safe for her to take them. Patient stated that when she takes them she only uses them for a couple of days. Patient stated that she has been told before that some medications last past their discard date. Patient stated that she has a lot left over and does not want to get a new script if not needed.

## 2020-07-09 NOTE — Telephone Encounter (Signed)
Left message on cell phone that per Dr. Ermalene Searing it is okay for her to use the diclofenac prn from last year.

## 2020-07-09 NOTE — Telephone Encounter (Signed)
Okay to use diclofenac prn from last year.

## 2020-07-12 ENCOUNTER — Telehealth: Payer: Self-pay | Admitting: Family Medicine

## 2020-07-12 NOTE — Telephone Encounter (Signed)
You can get the regular dose flu and the second shingles not, at same time in different arms.   As far as the booster.. I am not sure you qualify yet... am I missing a underlying medical condition for you? See below.   If you decide to get the booster 9 am not sure how rigorous they are screening for risk) I would get it after the other vaccines. There is no recommended time range following other vaccines but I would give it 2 weeks to make sure as little overlapping side effects as possible.   Kerby Nora, MD [facility] [place]   . The CDC recommends the third Pfizer dose six months after receipt of the second dose to the following groups of people: Americans 55 and older and residents in long-term care settings should receive it. People aged 55-64 years with underlying medical conditions should receive it.  People aged 18-49 years with underlying medical conditions may receive it, based on individual benefits and risks. People aged 18-64 years who are at increased risk for increased risk of occupational exposure and transmission, such as health care workers, may receive it, based on individual benefits and risks.   Underlying medical Conditions: Pregnancy related references were added in May 2021. Substance use disorders were based on evidence published between September 11, 2018, and January 2021. Asthma, blood disorders, cancer, cerebrovascular disease, chronic obstructive pulmonary disease (COPD), chronic kidney disease (CKD), cystic fibrosis, diabetes, Down syndrome, heart disease, hypertension, immunosuppressant medications, use of corticosteroids or other immunosuppressive medications, solid organ or blood stem cell transplantation, neurological conditions, and obesity were based on evidence published between September 11, 2018, and December 2020. Smoking was based on evidence published between September 11, 2018, and May 01, 2019

## 2020-07-12 NOTE — Telephone Encounter (Signed)
Pt called she has questions regarding vaccines.  She had her first shingles vaccine 2 months ago.  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

## 2020-07-12 NOTE — Telephone Encounter (Signed)
Sent below message via Mychart.

## 2020-07-25 ENCOUNTER — Other Ambulatory Visit: Payer: Self-pay

## 2020-07-25 ENCOUNTER — Ambulatory Visit (INDEPENDENT_AMBULATORY_CARE_PROVIDER_SITE_OTHER): Payer: Managed Care, Other (non HMO)

## 2020-07-25 DIAGNOSIS — Z23 Encounter for immunization: Secondary | ICD-10-CM | POA: Diagnosis not present

## 2020-08-10 ENCOUNTER — Ambulatory Visit: Payer: Managed Care, Other (non HMO) | Attending: Internal Medicine

## 2020-08-10 ENCOUNTER — Ambulatory Visit: Payer: Managed Care, Other (non HMO)

## 2020-08-10 DIAGNOSIS — Z23 Encounter for immunization: Secondary | ICD-10-CM

## 2020-08-10 NOTE — Progress Notes (Signed)
   Covid-19 Vaccination Clinic  Name:  [name]    MRN: [id number] DOB: 07/11/1966  08/10/2020  [name] was observed post Covid-19 immunization for 30 minutes based on pre-vaccination screening without incident. She was provided with Vaccine Information Sheet and instruction to access the V-Safe system.   [name] was instructed to call 911 with any severe reactions post vaccine: . Difficulty breathing  . Swelling of face and throat  . A fast heartbeat  . A bad rash all over body  . Dizziness and weakness      

## 2020-08-10 NOTE — Progress Notes (Signed)
   Covid-19 Vaccination Clinic  Name:  Catalena Stanhope    MRN: [id number] DOB: 1966-04-16  08/10/2020  Ms. Woolf was observed post Covid-19 immunization for 30 minutes based on pre-vaccination screening without incident. She was provided with Vaccine Information Sheet and instruction to access the V-Safe system.   Ms. Rath was instructed to call 911 with any severe reactions post vaccine: [name] Difficulty breathing  . Swelling of face and throat  . A fast heartbeat  . A bad rash all over body  . Dizziness and weakness

## 2020-09-13 ENCOUNTER — Encounter: Payer: Managed Care, Other (non HMO) | Admitting: Family Medicine

## 2020-09-13 ENCOUNTER — Other Ambulatory Visit: Payer: Self-pay

## 2020-09-13 ENCOUNTER — Ambulatory Visit (INDEPENDENT_AMBULATORY_CARE_PROVIDER_SITE_OTHER): Payer: Managed Care, Other (non HMO) | Admitting: Family Medicine

## 2020-09-13 ENCOUNTER — Encounter: Payer: Self-pay | Admitting: Family Medicine

## 2020-09-13 VITALS — BP 130/60 | HR 85 | Temp 97.7°F | Ht 62.0 in | Wt 129.5 lb

## 2020-09-13 DIAGNOSIS — Z13220 Encounter for screening for lipoid disorders: Secondary | ICD-10-CM | POA: Diagnosis not present

## 2020-09-13 DIAGNOSIS — Z23 Encounter for immunization: Secondary | ICD-10-CM | POA: Diagnosis not present

## 2020-09-13 DIAGNOSIS — Z8349 Family history of other endocrine, nutritional and metabolic diseases: Secondary | ICD-10-CM | POA: Diagnosis not present

## 2020-09-13 DIAGNOSIS — Z91018 Allergy to other foods: Secondary | ICD-10-CM

## 2020-09-13 DIAGNOSIS — Z8639 Personal history of other endocrine, nutritional and metabolic disease: Secondary | ICD-10-CM

## 2020-09-13 DIAGNOSIS — Z0000 Encounter for general adult medical examination without abnormal findings: Secondary | ICD-10-CM

## 2020-09-13 DIAGNOSIS — Z1159 Encounter for screening for other viral diseases: Secondary | ICD-10-CM

## 2020-09-13 LAB — COMPREHENSIVE METABOLIC PANEL
ALT: 10 U/L (ref 0–35)
AST: 13 U/L (ref 0–37)
Albumin: 4.9 g/dL (ref 3.5–5.2)
Alkaline Phosphatase: 59 U/L (ref 39–117)
BUN: 9 mg/dL (ref 6–23)
CO2: 30 mEq/L (ref 19–32)
Calcium: 9.6 mg/dL (ref 8.4–10.5)
Chloride: 103 mEq/L (ref 96–112)
Creatinine, Ser: 0.6 mg/dL (ref 0.40–1.20)
GFR: 101.15 mL/min (ref >60.00)
Glucose, Bld: 94 mg/dL (ref 70–99)
Potassium: 4.4 mEq/L (ref 3.5–5.1)
Sodium: 142 mEq/L (ref 135–145)
Total Bilirubin: 0.5 mg/dL (ref 0.2–1.2)
Total Protein: 7.2 g/dL (ref 6.0–8.3)

## 2020-09-13 LAB — LIPID PANEL
Cholesterol: 206 mg/dL — ABNORMAL HIGH (ref 0–200)
HDL: 83.5 mg/dL (ref >39.00)
LDL Cholesterol: 109 mg/dL — ABNORMAL HIGH (ref 0–99)
NonHDL: 122.56
Total CHOL/HDL Ratio: 2
Triglycerides: 69 mg/dL (ref 0.0–149.0)
VLDL: 13.8 mg/dL (ref 0.0–40.0)

## 2020-09-13 LAB — TSH: TSH: 0.38 u[IU]/mL (ref 0.35–4.50)

## 2020-09-13 MED ORDER — EPINEPHRINE 0.3 MG/0.3ML IJ SOAJ: 0.3000 mg | INTRAMUSCULAR | 0 refills | Status: DC | PRN

## 2020-09-13 NOTE — Assessment & Plan Note (Signed)
Resolved. Nml MIR 20 years ago. Calcium normal.

## 2020-09-13 NOTE — Progress Notes (Signed)
Chief Complaint  Patient presents with   Annual Exam    History of Present Illness: HPI The patient is here for annual wellness exam and preventative care.    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 MRI brain normal.   Due for labs today.  Diet: healthy Exercise: walking  Nightly to  Off and on.  Wt Readings from Last 3 Encounters:  09/13/20 129 lb 8 oz (58.7 kg)  09/01/19 129 lb 8 oz (58.7 kg)  08/30/18 133 lb 8 oz (60.6 kg)  Body mass index is 23.69 kg/m.  Migraine: rare occurrence.   Still having some issues off and on with sinuses... using flonase and  Zyrtec.. resolve.  Occ blood form nose and sinus pressure... now in last week.  Father dx with vascular dementia.  This visit occurred during the SARS-CoV-2 public health emergency.  Safety protocols were in place, including screening questions prior to the visit, additional usage of staff PPE, and extensive cleaning of exam room while observing appropriate contact time as indicated for disinfecting solutions.   COVID 19 screen:  No recent travel or known exposure to COVID19 The patient denies respiratory symptoms of COVID 19 at this time. The importance of social distancing was discussed today.     Review of Systems  Constitutional: Negative for chills and fever.  HENT: Negative for congestion and ear pain.   Eyes: Negative for pain and redness.  Respiratory: Negative for cough and shortness of breath.   Cardiovascular: Negative for chest pain, palpitations and leg swelling.  Gastrointestinal: Negative for abdominal pain, blood in stool, constipation, diarrhea, nausea and vomiting.  Genitourinary: Negative for dysuria.  Musculoskeletal: Negative for falls and myalgias.  Skin: Negative for rash.  Neurological: Negative for dizziness.  Psychiatric/Behavioral: Negative for depression. The patient is not nervous/anxious.       Past Medical History:  Diagnosis Date   Chest pain    Congenital  anomalies of pancreas    Esophageal spasm    GERD (gastroesophageal reflux disease)     reports that she has never smoked. She has never used smokeless tobacco. She reports that she does not drink alcohol and does not use drugs.   Current Outpatient Medications:    aspirin 81 MG tablet, Take 81 mg by mouth at bedtime.  , Disp: , Rfl:    baclofen (LIORESAL) 10 MG tablet, Take 5 mg by mouth every other day., Disp: , Rfl:    Cholecalciferol (VITAMIN D) 50 MCG (2000 UT) tablet, Take 2,000 Units by mouth daily., Disp: , Rfl:    Cyanocobalamin (VITAMIN B 12 PO), Take 1,000 mcg by mouth daily., Disp: , Rfl:    diclofenac (VOLTAREN) 75 MG EC tablet, Take 1 tablet (75 mg total) by mouth as needed., Disp: 30 tablet, Rfl: 0   EPINEPHrine 0.3 mg/0.3 mL IJ SOAJ injection, Inject 0.3 mLs (0.3 mg total) into the muscle as needed for anaphylaxis., Disp: 2 each, Rfl: 0   esomeprazole (NEXIUM) 20 MG capsule, Take 20 mg by mouth every other day., Disp: , Rfl:    Observations/Objective: Blood pressure 130/60, pulse 85, temperature 97.7 F (36.5 C), temperature source Temporal, height 5\' 2"  (1.575 m), weight 129 lb 8 oz (58.7 kg), SpO2 100 %.  Physical Exam Constitutional:      General: She is not in acute distress.    Appearance: Normal appearance. She is well-developed. She is not ill-appearing or toxic-appearing.  HENT:     Head: Normocephalic.  Right Ear: Hearing, tympanic membrane, ear canal and external ear normal. Tympanic membrane is not erythematous, retracted or bulging.     Left Ear: Hearing, tympanic membrane, ear canal and external ear normal. Tympanic membrane is not erythematous, retracted or bulging.     Nose: No mucosal edema or rhinorrhea.     Right Sinus: No maxillary sinus tenderness or frontal sinus tenderness.     Left Sinus: No maxillary sinus tenderness or frontal sinus tenderness.     Mouth/Throat:     Pharynx: Uvula midline.  Eyes:     General: Lids are normal. Lids  are everted, no foreign bodies appreciated.     Conjunctiva/sclera: Conjunctivae normal.     Pupils: Pupils are equal, round, and reactive to light.  Neck:     Thyroid: No thyroid mass or thyromegaly.     Vascular: No carotid bruit.     Trachea: Trachea normal.  Cardiovascular:     Rate and Rhythm: Normal rate and regular rhythm.     Pulses: Normal pulses.     Heart sounds: Normal heart sounds, S1 normal and S2 normal. No murmur heard.  No friction rub. No gallop.   Pulmonary:     Effort: Pulmonary effort is normal. No tachypnea or respiratory distress.     Breath sounds: Normal breath sounds. No decreased breath sounds, wheezing, rhonchi or rales.  Abdominal:     General: Bowel sounds are normal.     Palpations: Abdomen is soft.     Tenderness: There is no abdominal tenderness.  Musculoskeletal:     Cervical back: Normal range of motion and neck supple.  Skin:    General: Skin is warm and dry.     Findings: No rash.  Neurological:     Mental Status: She is alert.  Psychiatric:        Mood and Affect: Mood is not anxious or depressed.        Speech: Speech normal.        Behavior: Behavior normal. Behavior is cooperative.        Thought Content: Thought content normal.        Judgment: Judgment normal.      Assessment and Plan   The patient's preventative maintenance and recommended screening tests for an annual wellness exam were reviewed in full today. Brought up to date unless services declined.  Counselled on the importance of diet, exercise, and its role in overall health and mortality. The patient's FH and SH was reviewed, including their home life, tobacco status, and drug and alcohol status.   Vaccines:flu given 07/2020,Shingles vaccine.. second shingrix given today. COVID uptodate Sees GYN, nml pap, neg HPV 2019 COLON: nml 06/2015 repeat in 10 years. Mammo: nml 12/2019 Refused HIV testing nonsmoker ETOH/ Drug none/none  Plan hep C test next year. DEXA:  improving osteopenia 04/2020.. recheck in 5 years  [facility] allergy.. rx for epi pen given.[name] again given 2021  History of hyperprolactinemia Resolved. Nml MIR 20 years ago. Calcium normal.  [place] allergy Refilled  Epi pen.    Kerby Nora, MD

## 2020-09-13 NOTE — Patient Instructions (Signed)
Please stop at the lab to have labs drawn.  

## 2020-09-13 NOTE — Assessment & Plan Note (Signed)
Refilled Epipen

## 2020-09-16 LAB — HEPATITIS C ANTIBODY
Hepatitis C Ab: NONREACTIVE
SIGNAL TO CUT-OFF: 0.01 (ref <1.00)

## 2020-09-16 LAB — PROLACTIN: Prolactin: 9.9 ng/mL

## 2020-09-26 LAB — RESULTS CONSOLE HPV: CHL HPV: NEGATIVE

## 2020-09-26 LAB — HM PAP SMEAR: HM Pap smear: NEGATIVE

## 2020-11-25 ENCOUNTER — Ambulatory Visit: Payer: Self-pay | Admitting: Podiatry

## 2020-11-27 ENCOUNTER — Ambulatory Visit (INDEPENDENT_AMBULATORY_CARE_PROVIDER_SITE_OTHER): Payer: Managed Care, Other (non HMO) | Admitting: Podiatry

## 2020-11-27 ENCOUNTER — Other Ambulatory Visit: Payer: Self-pay

## 2020-11-27 DIAGNOSIS — L603 Nail dystrophy: Secondary | ICD-10-CM

## 2020-12-14 NOTE — Progress Notes (Signed)
   HPI: 55 y.o. female presenting today for evaluation of a nail problem to the left hallux nail plate is been going on approximately 6-8 months now. Sudden onset. She noticed no discoloration of the nail is lifting off of the toe. No pain. Patient presents for further treatment and evaluation. She denies a history of trauma  Past Medical History:  Diagnosis Date  . Chest pain   . Congenital anomalies of pancreas   . Esophageal spasm   . GERD (gastroesophageal reflux disease)      Physical Exam: General: The patient is alert and oriented x3 in no acute distress.  Dermatology: Skin is warm, dry and supple bilateral lower extremities. Negative for open lesions or macerations. Hyperkeratotic dystrophic nail noted to the left hallux nail plate  Vascular: Palpable pedal pulses bilaterally. No edema or erythema noted. Capillary refill within normal limits.  Neurological: Epicritic and protective threshold grossly intact bilaterally.   Musculoskeletal Exam: Range of motion within normal limits to all pedal and ankle joints bilateral. Muscle strength 5/5 in all groups bilateral.    Assessment: 1. Dystrophic nail left hallux   Plan of Care:  1. Patient evaluated.  2. Mechanical debridement of the nail plate was performed using a nail nipper without incident or bleeding 3. Recommend OTC Keracel antifungal topical 4. Return to clinic as needed      Felecia Shelling, DPM [facility]  Dr. Felecia Shelling, DPM    [address] [place], [place] [address]                Office [phone]  Fax [phone]

## 2020-12-26 LAB — HM MAMMOGRAPHY

## 2020-12-31 ENCOUNTER — Encounter: Payer: Self-pay | Admitting: Family Medicine

## 2021-04-11 ENCOUNTER — Telehealth: Payer: Self-pay | Admitting: Family Medicine

## 2021-04-11 MED ORDER — PROMETHAZINE-DM 6.25-15 MG/5ML PO SYRP: 5.0000 mL | ORAL_SOLUTION | Freq: Every evening | ORAL | 0 refills | Status: DC | PRN

## 2021-04-11 NOTE — Telephone Encounter (Signed)
Please call and triage.  

## 2021-04-11 NOTE — Telephone Encounter (Signed)
[facility] - Client TELEPHONE ADVICE RECORD Access[facility]urse Patient Name: [name] Gender: Female DOB: 1966/02/10 Age: 55 Y 10 M 9 D Return Phone Number: [phone] (Primary), [phone] (Secondary) Address: City/ State/ Zip: [place] [address] Client [facility] - Client Client Site [facility] - Day Physician Kerby Nora - MD Contact Type Call Who Is Calling Patient / Member / Family / Caregiver Call Type Triage / Clinical Relationship To Patient Self Return Phone Number [phone] (Secondary) Chief Complaint Cough Reason for Call Symptomatic / Request for Health Information Initial Comment Caller was transferred from the office. She has had a cold for three days and a cough since yesterday. She's tried over the counter medications and they have not worked. She wants to know what to do. There are no appointments until next week. Translation No Nurse Assessment Nurse: Suezanne Jacquet, RN, Riley Lam Date/Time (Eastern Time): 04/11/2021 2:59:24 PM Confirm and document reason for call. If symptomatic, describe symptoms. ---Caller was transferred from the office. She has had a cold for three days and a cough since yesterday. She's tried over the counter medications and they have not worked. She wants to know what to do. There are no appointments until next week. Does the patient have any new or worsening symptoms? ---Yes Will a triage be completed? ---Yes Related visit to physician within the last 2 weeks? ---No Does the PT have any chronic conditions? (i.e. diabetes, asthma, this includes High risk factors for pregnancy, etc.) ---No Is this a behavioral health or substance abuse call? ---No Guidelines Guideline Title Affirmed Question Affirmed Notes Nurse Date/Time (Eastern Time) COVID-19 - Diagnosed or Suspected [1] Continuous (nonstop) coughing interferes with work or school AND [2]  no improvement using cough treatment per Care Advice Suezanne Jacquet, RN, Riley Lam 04/11/2021 3:00:10 PM PLEASE NOTE: All timestamps contained within this report are represented as [place] Standard Time. CONFIDENTIALTY NOTICE: This fax transmission is intended only for the addressee. It contains information that is legally privileged, confidential or otherwise protected from use or disclosure. If you are not the intended recipient, you are strictly prohibited from reviewing, disclosing, copying using or disseminating any of this information or taking any action in reliance on or regarding this information. If you have received this fax in error, please notify us immediately by telephone so that we can arrange for its return to [place]. Phone: [phone], Toll-Free: [phone], Fax: [phone] Page: 2 of 2 Call Id: [id number] Disp. Time Lamount Cohen Time) Disposition Final User 04/11/2021 3:04:42 PM See HCP within 4 Hours (or PCP triage) Yes Suezanne Jacquet, RN, Riley Lam Disposition Overriden: Call PCP within 24 Hours Override Reason: Patient's symptoms need a higher level of care Caller Disagree/Comply Comply Caller Understands Yes PreDisposition Did not know what to do Care Advice Given Per Guideline SEE HCP (OR PCP TRIAGE) WITHIN 4 HOURS: * UCC: Some UCCs can manage patients who are stable and have less serious symptoms (e.g., minor illnesses and injuries). The triager must know the [facility] capabilities before sending a patient there. If unsure, call ahead. CALL BACK IF: * You become worse COUGH SYRUP WITH DEXTROMETHORPHAN - EXTRA NOTES AND WARNINGS: Referrals GO TO FACILITY UNDECIDED

## 2021-04-11 NOTE — Telephone Encounter (Signed)
Pt called back and pt is not sure if home test was positive or neg. Pt has appt at [facility] to have covid test at 5 PM and pt is not sure if rapid or PCR. Pt request cb if Dr Ermalene Searing will call in cough med to [facility] or what should pt do. Pt is getting covid test in a few mins. Pt request cb on cell.

## 2021-04-11 NOTE — Telephone Encounter (Signed)
Agree with plan for COVID test.  Will call in rx for cough medication.

## 2021-04-11 NOTE — Telephone Encounter (Signed)
Pt notified as instructed and voiced understanding; pt will pick up med at pharmacy and pt just had PCR covid test at [facility] and pt will cb with covid results [facility] from [facility] Sending note to Dr Ermalene Searing as Lorain Childes. UC & ED precautions reviewed with pt and pt voiced understanding.

## 2021-04-11 NOTE — Telephone Encounter (Signed)
Patient call in requesting to have Cough medication call in .stated had cough for about 3 days and over the counter medication is not working

## 2021-04-11 NOTE — Telephone Encounter (Signed)
Call patient to be triage by access nurse

## 2021-04-11 NOTE — Telephone Encounter (Signed)
No fever, chills, or body aches. Pt has stuffy head and runny nose and pt said S/T was so sore it was hard to swallow but today pt said throat is more scratchy. Pt has dry cough. NO SOB or CP.No H/A, diarrhea or vomiting and no loss of taste or smell. No known exposure to any one with covid virus.No rattling or wheezing in lungs. Pt said she does not feel bad and does not want to go to UC. Pt is using honey lemon lozenges and is not taking OTC cough med syrup because in the past that has not worked.Pt just took home covid test and pt will cb with results shortly. [facility].

## 2021-04-15 NOTE — Telephone Encounter (Signed)
Pt reports she is feeling better and has no symptoms currently. She reports cough and sore throat have cleared completely. She did want PCP to know the cough medicine sent in did not help at all and "was like sugar water." She would like it removed from her med list. She talked to the pharmacist and they recommended night time NyQuil and that helped. Just and FYI, pt received results on 04/13/21. Pt denies any symptoms currently or needing any further assistance at this time. Advised pt if any symptoms returned or she developed any new symptoms to contact the office immediately. Advised of ER precautions. Pt verbalized understanding.

## 2021-04-15 NOTE — Telephone Encounter (Signed)
Pt called in with test results. She stated her test came back positive.

## 2021-04-15 NOTE — Telephone Encounter (Signed)
Please triage and make appt if appropriate.

## 2021-08-08 ENCOUNTER — Ambulatory Visit (INDEPENDENT_AMBULATORY_CARE_PROVIDER_SITE_OTHER): Payer: Managed Care, Other (non HMO)

## 2021-08-08 ENCOUNTER — Other Ambulatory Visit: Payer: Self-pay

## 2021-08-08 DIAGNOSIS — Z23 Encounter for immunization: Secondary | ICD-10-CM

## 2021-09-01 ENCOUNTER — Telehealth: Payer: Self-pay | Admitting: Family Medicine

## 2021-09-01 NOTE — Telephone Encounter (Signed)
Pt called in would like to know if she can use neosporin . She has red skin irritation over her left eye . Please Advise # [phone]

## 2021-09-02 NOTE — Telephone Encounter (Signed)
Please triage

## 2021-09-02 NOTE — Telephone Encounter (Signed)
Left message for Ms. Blackard that it would be okay to use neosporin in this area. If not improving she can also try Cortisone 10 cream BID for possible dry skin/eczema/allergic dermatitis etc.

## 2021-09-02 NOTE — Telephone Encounter (Signed)
Unable to reach pt by phone at any contact # and left v/m requesting cb.

## 2021-09-02 NOTE — Telephone Encounter (Signed)
I spoke with pt; pt said has reddened skin irritation over lt eye. No blisters or rash seen. Area does not burn or itch. Pt noticed the red area 2 days ago. Pt said she had similar red area with swelling; pt has been using flonase and taking zyrtec but is not sure if that is helping. Pt said the red area is in the crease near her brow. There is no swelling to eye and no redness or itching or pain in eye. No drainage from eye. Pt wonders if could use neosporin on this area. Pt said when applies lotion it burns. Pt used lotion due to scaly area and dryness to upper outside of eye.No change in vision. [facility]. Pt request cb after reviewed by Dr Ermalene Searing. Sending note to Dr Ermalene Searing and Lupita Leash CMA.

## 2021-09-02 NOTE — Telephone Encounter (Signed)
Call   Sounds like it would be okay to use neosporin in this area. If not improving she can also try Cortisone 10 cream BID for possible dry skin/eczema/allergic dermatitis etc.

## 2021-09-08 ENCOUNTER — Telehealth: Payer: Self-pay

## 2021-09-08 NOTE — Telephone Encounter (Signed)
Not sure what happened it was access nurse note that was copied to this. Im sorry just had symptoms listed. Not sure why it didn't save on here.

## 2021-09-08 NOTE — Telephone Encounter (Signed)
Patient has appointment tomorrow. Will send for Review to [facility] Dr. Ermalene Searing.

## 2021-09-08 NOTE — Telephone Encounter (Signed)
Can the access nurse note still be attached or sent separately?

## 2021-09-08 NOTE — Telephone Encounter (Signed)
Joellen, I don't see anything to review.  Am I missing something?

## 2021-09-09 ENCOUNTER — Other Ambulatory Visit: Payer: Self-pay

## 2021-09-09 ENCOUNTER — Encounter: Payer: Self-pay | Admitting: Family Medicine

## 2021-09-09 ENCOUNTER — Ambulatory Visit (INDEPENDENT_AMBULATORY_CARE_PROVIDER_SITE_OTHER): Payer: Managed Care, Other (non HMO) | Admitting: Family Medicine

## 2021-09-09 VITALS — BP 100/64 | HR 82 | Temp 97.6°F | Ht 62.0 in | Wt 123.1 lb

## 2021-09-09 DIAGNOSIS — Z8639 Personal history of other endocrine, nutritional and metabolic disease: Secondary | ICD-10-CM | POA: Diagnosis not present

## 2021-09-09 DIAGNOSIS — Z13220 Encounter for screening for lipoid disorders: Secondary | ICD-10-CM | POA: Insufficient documentation

## 2021-09-09 DIAGNOSIS — R1013 Epigastric pain: Secondary | ICD-10-CM | POA: Diagnosis not present

## 2021-09-09 LAB — CBC WITH DIFFERENTIAL/PLATELET
Basophils Absolute: 0 10*3/uL (ref 0.0–0.1)
Basophils Relative: 0.7 % (ref 0.0–3.0)
Eosinophils Absolute: 0.1 10*3/uL (ref 0.0–0.7)
Eosinophils Relative: 1.9 % (ref 0.0–5.0)
HCT: 36.8 % (ref 36.0–46.0)
Hemoglobin: 12.4 g/dL (ref 12.0–15.0)
Lymphocytes Relative: 29.9 % (ref 12.0–46.0)
Lymphs Abs: 1.5 10*3/uL (ref 0.7–4.0)
MCHC: 33.7 g/dL (ref 30.0–36.0)
MCV: 83.9 fl (ref 78.0–100.0)
Monocytes Absolute: 0.8 10*3/uL (ref 0.1–1.0)
Monocytes Relative: 15.9 % — ABNORMAL HIGH (ref 3.0–12.0)
Neutro Abs: 2.6 10*3/uL (ref 1.4–7.7)
Neutrophils Relative %: 51.6 % (ref 43.0–77.0)
Platelets: 305 10*3/uL (ref 150.0–400.0)
RBC: 4.39 Mil/uL (ref 3.87–5.11)
RDW: 12.7 % (ref 11.5–15.5)
WBC: 5 10*3/uL (ref 4.0–10.5)

## 2021-09-09 LAB — COMPREHENSIVE METABOLIC PANEL
ALT: 17 U/L (ref 0–35)
AST: 21 U/L (ref 0–37)
Albumin: 4.5 g/dL (ref 3.5–5.2)
Alkaline Phosphatase: 55 U/L (ref 39–117)
BUN: 14 mg/dL (ref 6–23)
CO2: 30 mEq/L (ref 19–32)
Calcium: 9.3 mg/dL (ref 8.4–10.5)
Chloride: 102 mEq/L (ref 96–112)
Creatinine, Ser: 0.62 mg/dL (ref 0.40–1.20)
GFR: 99.66 mL/min (ref >60.00)
Glucose, Bld: 102 mg/dL — ABNORMAL HIGH (ref 70–99)
Potassium: 3.9 mEq/L (ref 3.5–5.1)
Sodium: 140 mEq/L (ref 135–145)
Total Bilirubin: 0.4 mg/dL (ref 0.2–1.2)
Total Protein: 7 g/dL (ref 6.0–8.3)

## 2021-09-09 LAB — LIPID PANEL
Cholesterol: 177 mg/dL (ref 0–200)
HDL: 74.1 mg/dL (ref >39.00)
LDL Cholesterol: 91 mg/dL (ref 0–99)
NonHDL: 102.83
Total CHOL/HDL Ratio: 2
Triglycerides: 61 mg/dL (ref 0.0–149.0)
VLDL: 12.2 mg/dL (ref 0.0–40.0)

## 2021-09-09 LAB — LIPASE: Lipase: 29 U/L (ref 11.0–59.0)

## 2021-09-09 NOTE — Progress Notes (Signed)
Patient ID: [name], female    DOB: 1966-02-25, 55 y.o.   MRN: [id number]  This visit was conducted in person.  BP 100/64   Pulse 82   Temp 97.6 F (36.4 C) (Temporal)   Ht 5\' 2"  (1.575 m)   Wt 123 lb 2 oz (55.8 kg)   SpO2 100%   BMI 22.52 kg/m    CC: Chief Complaint  Patient presents with   Abdominal Pain    Started on Saturday   Fever    100.2 temperature yesterday   Nausea    Subjective:   HPI: [name] is a 55 y.o. female presenting on 09/09/2021 for Abdominal Pain (Started on Saturday), Fever (100.2 temperature yesterday), and Nausea   She reports new onset pain  4 days ago in upper abdomen... had nausea and reflux but was unable to throw up. Only had breakfast and juice  smoothie at lunch  Ate out BBQ the night before  Felt burning in chest, sour taste in mouth.  Gradually started feeling better... until next night. Yesterday felt batter but had temp 100.2 F.  She has been keeping up with liquid 24 oz total.  No UOP.   Normal BMs but at end of yesterday, looser stool.  sick contacts.. but husband noted some stomach ache but not as bad as him.  Has not taken anything for her symptoms  No NSAID use.   She has been trying to wean off nexium in past month.. taking every 3-4.          Relevant past medical, surgical, family and social history reviewed and updated as indicated. Interim medical history since our last visit reviewed. Allergies and medications reviewed and updated. Outpatient Medications Prior to Visit  Medication Sig Dispense Refill   aspirin 81 MG tablet Take 81 mg by mouth at bedtime.       baclofen (LIORESAL) 10 MG tablet Take 5 mg by mouth every other day.     Cholecalciferol (VITAMIN D) 50 MCG (2000 UT) tablet Take 2,000 Units by mouth daily.     Cyanocobalamin (VITAMIN B 12 PO) Take 1,000 mcg by mouth daily.     EPINEPHrine 0.3 mg/0.3 mL IJ SOAJ injection Inject 0.3 mg into the muscle as needed for anaphylaxis.  2 each 0   esomeprazole (NEXIUM) 20 MG capsule Take 20 mg by mouth every other day.     hydrocortisone (ANUSOL-HC) 2.5 % rectal cream SMARTSIG:Sparingly Topical 2-4 Times Daily     diclofenac (VOLTAREN) 75 MG EC tablet Take 1 tablet (75 mg total) by mouth as needed. 30 tablet 0   ciclopirox (PENLAC) 8 % solution SMARTSIG:1 Milliliter(s) Topical Twice Daily     promethazine-dextromethorphan (PROMETHAZINE-DM) 6.25-15 MG/5ML syrup Take 5 mLs by mouth at bedtime as needed for cough. 118 mL 0   No facility-administered medications prior to visit.     Per HPI unless specifically indicated in ROS section below Review of Systems  Constitutional:  Negative for fatigue and fever.  HENT:  Negative for congestion.   Eyes:  Negative for pain.  Respiratory:  Negative for cough and shortness of breath.   Cardiovascular:  Negative for chest pain, palpitations and leg swelling.  Gastrointestinal:  Positive for abdominal pain and nausea. Negative for blood in stool and constipation.  Genitourinary:  Negative for dysuria and vaginal bleeding.  Musculoskeletal:  Negative for back pain.  Neurological:  Negative for syncope, light-headedness and headaches.  Psychiatric/Behavioral:  Negative for dysphoric mood.  Objective:  BP 100/64   Pulse 82   Temp 97.6 F (36.4 C) (Temporal)   Ht 5\' 2"  (1.575 m)   Wt 123 lb 2 oz (55.8 kg)   SpO2 100%   BMI 22.52 kg/m   Wt Readings from Last 3 Encounters:  09/09/21 123 lb 2 oz (55.8 kg)  09/13/20 129 lb 8 oz (58.7 kg)  09/01/19 129 lb 8 oz (58.7 kg)      Physical Exam Constitutional:      General: She is not in acute distress.    Appearance: Normal appearance. She is well-developed. She is not ill-appearing or toxic-appearing.  HENT:     Head: Normocephalic.     Right Ear: Hearing, tympanic membrane, ear canal and external ear normal. Tympanic membrane is not erythematous, retracted or bulging.     Left Ear: Hearing, tympanic membrane, ear canal and  external ear normal. Tympanic membrane is not erythematous, retracted or bulging.     Nose: No mucosal edema or rhinorrhea.     Right Sinus: No maxillary sinus tenderness or frontal sinus tenderness.     Left Sinus: No maxillary sinus tenderness or frontal sinus tenderness.     Mouth/Throat:     Pharynx: Uvula midline.  Eyes:     General: Lids are normal. Lids are everted, no foreign bodies appreciated.     Conjunctiva/sclera: Conjunctivae normal.     Pupils: Pupils are equal, round, and reactive to light.  Neck:     Thyroid: No thyroid mass or thyromegaly.     Vascular: No carotid bruit.     Trachea: Trachea normal.  Cardiovascular:     Rate and Rhythm: Normal rate and regular rhythm.     Pulses: Normal pulses.     Heart sounds: Normal heart sounds, S1 normal and S2 normal. No murmur heard.   No friction rub. No gallop.  Pulmonary:     Effort: Pulmonary effort is normal. No tachypnea or respiratory distress.     Breath sounds: Normal breath sounds. No decreased breath sounds, wheezing, rhonchi or rales.  Abdominal:     General: Bowel sounds are normal.     Palpations: Abdomen is soft.     Tenderness: There is abdominal tenderness in the epigastric area. There is no right CVA tenderness, left CVA tenderness, guarding or rebound.     Hernia: No hernia is present.  Musculoskeletal:     Cervical back: Normal range of motion and neck supple.  Skin:    General: Skin is warm and dry.     Findings: No rash.  Neurological:     Mental Status: She is alert.  Psychiatric:        Mood and Affect: Mood is not anxious or depressed.        Speech: Speech normal.        Behavior: Behavior normal. Behavior is cooperative.        Thought Content: Thought content normal.        Judgment: Judgment normal.      Results for orders placed or performed in visit on 12/31/20  HM MAMMOGRAPHY  Result Value Ref Range   HM Mammogram 0-4 Bi-Rad 0-4 Bi-Rad, Self Reported Normal    This visit occurred  during the SARS-CoV-2 public health emergency.  Safety protocols were in place, including screening questions prior to the visit, additional usage of staff PPE, and extensive cleaning of exam room while observing appropriate contact time as indicated for disinfecting solutions.   COVID 19  screen:  No recent travel or known exposure to COVID19 The patient denies respiratory symptoms of COVID 19 at this time. The importance of social distancing was discussed today.   Assessment and Plan    Problem List Items Addressed This Visit     Epigastric pain - Primary    Given feverlow grade.. possible food poisioning vds viral garoenteritis.   Also on differenctial given stopped PPI.[name] gastritis, GERD vs pancreatitis.  Will eval with labs.   Rest, fluids, advance diet.  ER precautions given.      Relevant Orders   Comprehensive metabolic panel   Lipase   CBC with Differential/Platelet   History of hyperprolactinemia   Relevant Orders   Prolactin   Screening for hypercholesterolemia    Due for routine lab work fasting for upcoming physical      Relevant Orders   Lipid panel     Kerby Nora, MD

## 2021-09-09 NOTE — Assessment & Plan Note (Signed)
Given feverlow grade.. possible food poisioning vds viral garoenteritis.   Also on differenctial given stopped PPI.[name] gastritis, GERD vs pancreatitis.  Will eval with labs.   Rest, fluids, advance diet.  ER precautions given.

## 2021-09-09 NOTE — Assessment & Plan Note (Signed)
Due for routine lab work fasting for upcoming physical

## 2021-09-09 NOTE — Patient Instructions (Addendum)
Please stop at the lab to have labs drawn. Bland food, push fluids.  Rest.  Call if pain is increasing, go to ER is severe.  Start Nexium back once  daily.

## 2021-09-10 LAB — PROLACTIN: Prolactin: 10.4 ng/mL

## 2021-09-18 ENCOUNTER — Encounter: Payer: Self-pay | Admitting: Family Medicine

## 2021-09-18 ENCOUNTER — Other Ambulatory Visit: Payer: Self-pay

## 2021-09-18 ENCOUNTER — Ambulatory Visit (INDEPENDENT_AMBULATORY_CARE_PROVIDER_SITE_OTHER): Payer: Managed Care, Other (non HMO) | Admitting: Family Medicine

## 2021-09-18 VITALS — BP 102/60 | HR 68 | Temp 97.8°F | Ht 62.0 in | Wt 123.5 lb

## 2021-09-18 DIAGNOSIS — Z0000 Encounter for general adult medical examination without abnormal findings: Secondary | ICD-10-CM | POA: Diagnosis not present

## 2021-09-18 DIAGNOSIS — Z8639 Personal history of other endocrine, nutritional and metabolic disease: Secondary | ICD-10-CM | POA: Diagnosis not present

## 2021-09-18 NOTE — Progress Notes (Signed)
Patient ID: Analea Muller, female    DOB: 05/20/1966, 55 y.o.   MRN: [id number]  This visit was conducted in person.  BP 102/60   Pulse 68   Temp 97.8 F (36.6 C) (Temporal)   Ht 5\' 2"  (1.575 m)   Wt 123 lb 8 oz (56 kg)   SpO2 98%   BMI 22.59 kg/m    CC: Chief Complaint  Patient presents with   Annual Exam    Subjective:   HPI: Dionisia Pacholski is a 55 y.o. female presenting on 09/18/2021 for Annual Exam  Recent OV on 09/09/2021 for epigastric pain. Possible food poisioning vs viral garoenteritis.   Also on differenctial given stopped PPI.09/11/2021 gastritis, GERD vs pancreatitis. Lab eval was unremarkable. She started back on PPI.   Today she reports resolution of pain.. did  have an episode of pain after eating cheeseburger 5 days ago.. ? Gallbladder issue. If recurs despite PPI consider [name] to eval gallbladder.  Reviewed labs in detail with patient.  Diet: Good  Exercise: walking  45 mindaily to every few days.  Lab Results  Component Value Date   CHOL 177 09/09/2021   HDL 74.10 09/09/2021   LDLCALC 91 09/09/2021   LDLDIRECT 121.7 09/29/2013   TRIG 61.0 09/09/2021   CHOLHDL 2 09/09/2021   The 10-year ASCVD risk score (Arnett DK, et al., 2019) is: 1%   Values used to calculate the score:     Age: 83 years     Sex: Female     Is Non-Hispanic African American: No     Diabetic: No     Tobacco smoker: No     Systolic Blood Pressure: 102 mmHg     Is BP treated: No     HDL Cholesterol: 74.1 mg/dL     Total Cholesterol: 177 mg/dL       Relevant past medical, surgical, family and social history reviewed and updated as indicated. Interim medical history since our last visit reviewed. Allergies and medications reviewed and updated. Outpatient Medications Prior to Visit  Medication Sig Dispense Refill   aspirin 81 MG tablet Take 81 mg by mouth at bedtime.       baclofen (LIORESAL) 10 MG tablet Take 5 mg by mouth every other day.     Cholecalciferol (VITAMIN  D) 50 MCG (2000 UT) tablet Take 2,000 Units by mouth daily.     Cyanocobalamin (VITAMIN B 12 PO) Take 1,000 mcg by mouth daily.     EPINEPHrine 0.3 mg/0.3 mL IJ SOAJ injection Inject 0.3 mg into the muscle as needed for anaphylaxis. 2 each 0   esomeprazole (NEXIUM) 20 MG capsule Take 20 mg by mouth every other day.     hydrocortisone (ANUSOL-HC) 2.5 % rectal cream SMARTSIG:Sparingly Topical 2-4 Times Daily     No facility-administered medications prior to visit.     Per HPI unless specifically indicated in ROS section below Review of Systems  Constitutional:  Negative for fatigue and fever.  HENT:  Negative for congestion.   Eyes:  Negative for pain.  Respiratory:  Negative for cough and shortness of breath.   Cardiovascular:  Negative for chest pain, palpitations and leg swelling.  Gastrointestinal:  Negative for abdominal pain.  Genitourinary:  Negative for dysuria and vaginal bleeding.  Musculoskeletal:  Negative for back pain.  Neurological:  Negative for syncope, light-headedness and headaches.  Psychiatric/Behavioral:  Negative for dysphoric mood.   Objective:  BP 102/60   Pulse 68   Temp  97.8 F (36.6 C) (Temporal)   Ht 5\' 2"  (1.575 m)   Wt 123 lb 8 oz (56 kg)   SpO2 98%   BMI 22.59 kg/m   Wt Readings from Last 3 Encounters:  09/18/21 123 lb 8 oz (56 kg)  09/09/21 123 lb 2 oz (55.8 kg)  09/13/20 129 lb 8 oz (58.7 kg)      Physical Exam    Results for orders placed or performed in visit on 09/09/21  Lipid panel  Result Value Ref Range   Cholesterol 177 0 - 200 mg/dL   Triglycerides 09/11/21 0.0 - 149.0 mg/dL   HDL 40.9 81.19 mg/dL   VLDL >14.78 0.0 - 29.5 mg/dL   LDL Cholesterol 91 0 - 99 mg/dL   Total CHOL/HDL Ratio 2    NonHDL 102.83   Comprehensive metabolic panel  Result Value Ref Range   Sodium 140 135 - 145 mEq/L   Potassium 3.9 3.5 - 5.1 mEq/L   Chloride 102 96 - 112 mEq/L   CO2 30 19 - 32 mEq/L   Glucose, Bld 102 (H) 70 - 99 mg/dL   BUN 14 6 - 23  mg/dL   Creatinine, Ser 62.1 0.40 - 1.20 mg/dL   Total Bilirubin 0.4 0.2 - 1.2 mg/dL   Alkaline Phosphatase 55 39 - 117 U/L   AST 21 0 - 37 U/L   ALT 17 0 - 35 U/L   Total Protein 7.0 6.0 - 8.3 g/dL   Albumin 4.5 3.5 - 5.2 g/dL   GFR 3.08 65.78 mL/min   Calcium 9.3 8.4 - 10.5 mg/dL  Lipase  Result Value Ref Range   Lipase 29.0 11.0 - 59.0 U/L  Prolactin  Result Value Ref Range   Prolactin 10.4 ng/mL  CBC with Differential/Platelet  Result Value Ref Range   WBC 5.0 4.0 - 10.5 K/uL   RBC 4.39 3.87 - 5.11 Mil/uL   Hemoglobin 12.4 12.0 - 15.0 g/dL   HCT >46.96 29.5 - 28.4 %   MCV 83.9 78.0 - 100.0 fl   MCHC 33.7 30.0 - 36.0 g/dL   RDW 13.2 44.0 - 10.2 %   Platelets 305.0 150.0 - 400.0 K/uL   Neutrophils Relative % 51.6 43.0 - 77.0 %   Lymphocytes Relative 29.9 12.0 - 46.0 %   Monocytes Relative 15.9 (H) 3.0 - 12.0 %   Eosinophils Relative 1.9 0.0 - 5.0 %   Basophils Relative 0.7 0.0 - 3.0 %   Neutro Abs 2.6 1.4 - 7.7 K/uL   Lymphs Abs 1.5 0.7 - 4.0 K/uL   Monocytes Absolute 0.8 0.1 - 1.0 K/uL   Eosinophils Absolute 0.1 0.0 - 0.7 K/uL   Basophils Absolute 0.0 0.0 - 0.1 K/uL    This visit occurred during the SARS-CoV-2 public health emergency.  Safety protocols were in place, including screening questions prior to the visit, additional usage of staff PPE, and extensive cleaning of exam room while observing appropriate contact time as indicated for disinfecting solutions.   COVID 19 screen:  No recent travel or known exposure to COVID19 The patient denies respiratory symptoms of COVID 19 at this time. The importance of social distancing was discussed today.   Assessment and Plan   The patient's preventative maintenance and recommended screening tests for an annual wellness exam were reviewed in full today. Brought up to date unless services declined.  Counselled on the importance of diet, exercise, and its role in overall health and mortality. The patient's FH and SH was  reviewed, including their home life, tobacco status, and drug and alcohol status.   Vaccines: flu given 07/2021, Shingles vaccine uptodate. COVID uptodate x 3  Sees GYN , nml pap, neg HPV 2019 COLON: nml 06/2015  repeat in 10 years. Mammo: nml 12/2020  Refused HIV testing  nonsmoker  ETOH/ Drug none/none  Hep C: done DEXA: improving osteopenia 04/2020.. recheck in 5 years  Kerby Nora, MD

## 2021-09-18 NOTE — Patient Instructions (Signed)
If pain recurs in abdomen.. call for possible re-eval and/or gallbladder US.  Can retry weaning off the Nexium after 4-6 weeks.  Increase back regular exercise as able.

## 2021-09-19 ENCOUNTER — Encounter: Payer: Managed Care, Other (non HMO) | Admitting: Family Medicine

## 2021-12-05 ENCOUNTER — Other Ambulatory Visit: Payer: Self-pay

## 2021-12-05 ENCOUNTER — Ambulatory Visit (INDEPENDENT_AMBULATORY_CARE_PROVIDER_SITE_OTHER): Payer: Managed Care, Other (non HMO) | Admitting: Family Medicine

## 2021-12-05 ENCOUNTER — Encounter: Payer: Self-pay | Admitting: Family Medicine

## 2021-12-05 VITALS — BP 110/60 | HR 79 | Temp 98.5°F | Ht 62.0 in | Wt 124.4 lb

## 2021-12-05 DIAGNOSIS — R0789 Other chest pain: Secondary | ICD-10-CM | POA: Diagnosis not present

## 2021-12-05 MED ORDER — EPINEPHRINE 0.3 MG/0.3ML IJ SOAJ
0.3000 mg | INTRAMUSCULAR | 0 refills | Status: DC | PRN
Start: 2021-12-05 — End: 2022-10-22

## 2021-12-05 MED ORDER — DICLOFENAC SODIUM 75 MG PO TBEC: 75.0000 mg | DELAYED_RELEASE_TABLET | Freq: Two times a day (BID) | ORAL | 0 refills | Status: DC

## 2021-12-05 NOTE — Assessment & Plan Note (Signed)
Noncardiac in description and exam. No indication for EKG.   Treat with NSAIDs, heat and start home PT.

## 2021-12-05 NOTE — Progress Notes (Signed)
Patient ID: [name], female    DOB: [birth year], 56 y.o.   MRN: [id number]  This visit was conducted in person.  BP 110/60    Pulse 79    Temp 98.5 F (36.9 C) (Temporal)    Ht 5\' 2"  (1.575 m)    Wt 124 lb 6 oz (56.4 kg)    SpO2 100%    BMI 22.75 kg/m    CC:  Chief Complaint  Patient presents with   Shoulder Pain    Left x 1 month    Subjective:   HPI: [name] is a 56 y.o. female presenting on 12/05/2021 for Shoulder Pain (Left x 1 month)  She reports left anterior shoulder pain off and  on in last month. Occ pain in left shoulder blade in back.  No radiation of pain to arm. No associated symptoms.  Some worse with movement of left arm.  Occ pain into left breast... has mammogram scheduled next month   Occurs at rest.   She walks regularly... no CP, no SOB, no  new fatigue.. but she is under stress and gets tired a lot.  No rash  Larey Seat in last month when tripped on pavement landed on left hand.  No new activity,    She has tried advil 2-3 a day... no stomach upset or heartburn.      Relevant past medical, surgical, family and social history reviewed and updated as indicated. Interim medical history since our last visit reviewed. Allergies and medications reviewed and updated. Outpatient Medications Prior to Visit  Medication Sig Dispense Refill   baclofen (LIORESAL) 10 MG tablet Take 5 mg by mouth every other day.     Cholecalciferol (VITAMIN D) 50 MCG (2000 UT) tablet Take 2,000 Units by mouth daily.     Cyanocobalamin (VITAMIN B 12 PO) Take 1,000 mcg by mouth daily.     EPINEPHrine 0.3 mg/0.3 mL IJ SOAJ injection Inject 0.3 mg into the muscle as needed for anaphylaxis. 2 each 0   esomeprazole (NEXIUM) 20 MG capsule Take 20 mg by mouth every other day.     hydrocortisone (ANUSOL-HC) 2.5 % rectal cream SMARTSIG:Sparingly Topical 2-4 Times Daily     No facility-administered medications prior to visit.     Per HPI unless specifically indicated in  ROS section below Review of Systems  Constitutional:  Negative for fatigue and fever.  HENT:  Negative for ear pain.   Eyes:  Negative for pain.  Respiratory:  Negative for chest tightness and shortness of breath.   Cardiovascular:  Negative for chest pain, palpitations and leg swelling.  Gastrointestinal:  Negative for abdominal pain.  Genitourinary:  Negative for dysuria.  Objective:  BP 110/60    Pulse 79    Temp 98.5 F (36.9 C) (Temporal)    Ht 5\' 2"  (1.575 m)    Wt 124 lb 6 oz (56.4 kg)    SpO2 100%    BMI 22.75 kg/m   Wt Readings from Last 3 Encounters:  12/05/21 124 lb 6 oz (56.4 kg)  09/18/21 123 lb 8 oz (56 kg)  09/09/21 123 lb 2 oz (55.8 kg)      Physical Exam Constitutional:      General: She is not in acute distress.    Appearance: Normal appearance. She is well-developed. She is not ill-appearing or toxic-appearing.  HENT:     Head: Normocephalic.     Right Ear: Hearing, tympanic membrane, ear canal and external ear normal.  Tympanic membrane is not erythematous, retracted or bulging.     Left Ear: Hearing, tympanic membrane, ear canal and external ear normal. Tympanic membrane is not erythematous, retracted or bulging.     Nose: No mucosal edema or rhinorrhea.     Right Sinus: No maxillary sinus tenderness or frontal sinus tenderness.     Left Sinus: No maxillary sinus tenderness or frontal sinus tenderness.     Mouth/Throat:     Pharynx: Uvula midline.  Eyes:     General: Lids are normal. Lids are everted, no foreign bodies appreciated.     Conjunctiva/sclera: Conjunctivae normal.     Pupils: Pupils are equal, round, and reactive to light.  Neck:     Thyroid: No thyroid mass or thyromegaly.     Vascular: No carotid bruit.     Trachea: Trachea normal.  Cardiovascular:     Rate and Rhythm: Normal rate and regular rhythm.     Pulses: Normal pulses.     Heart sounds: Normal heart sounds, S1 normal and S2 normal. No murmur heard.   No friction rub. No gallop.   Pulmonary:     Effort: Pulmonary effort is normal. No tachypnea or respiratory distress.     Breath sounds: Normal breath sounds. No decreased breath sounds, wheezing, rhonchi or rales.  Chest:     Chest wall: Tenderness present. No mass, lacerations, deformity, swelling, crepitus or edema. There is no dullness to percussion.     Comments: Ttp right lateral to sternocosto junction Abdominal:     General: Bowel sounds are normal.     Palpations: Abdomen is soft.     Tenderness: There is no abdominal tenderness.  Musculoskeletal:     Cervical back: Normal range of motion and neck supple.  Skin:    General: Skin is warm and dry.     Findings: No rash.  Neurological:     Mental Status: She is alert.  Psychiatric:        Mood and Affect: Mood is not anxious or depressed.        Speech: Speech normal.        Behavior: Behavior normal. Behavior is cooperative.        Thought Content: Thought content normal.        Judgment: Judgment normal.      Results for orders placed or performed in visit on 09/09/21  Lipid panel  Result Value Ref Range   Cholesterol 177 0 - 200 mg/dL   Triglycerides 61.0 0.0 - 149.0 mg/dL   HDL 74.10 >39.00 mg/dL   VLDL 12.2 0.0 - 40.0 mg/dL   LDL Cholesterol 91 0 - 99 mg/dL   Total CHOL/HDL Ratio 2    NonHDL 102.83   Comprehensive metabolic panel  Result Value Ref Range   Sodium 140 135 - 145 mEq/L   Potassium 3.9 3.5 - 5.1 mEq/L   Chloride 102 96 - 112 mEq/L   CO2 30 19 - 32 mEq/L   Glucose, Bld 102 (H) 70 - 99 mg/dL   BUN 14 6 - 23 mg/dL   Creatinine, Ser 0.62 0.40 - 1.20 mg/dL   Total Bilirubin 0.4 0.2 - 1.2 mg/dL   Alkaline Phosphatase 55 39 - 117 U/L   AST 21 0 - 37 U/L   ALT 17 0 - 35 U/L   Total Protein 7.0 6.0 - 8.3 g/dL   Albumin 4.5 3.5 - 5.2 g/dL   GFR 99.66 >60.00 mL/min   Calcium 9.3 8.4 - 10.5  mg/dL  Lipase  Result Value Ref Range   Lipase 29.0 11.0 - 59.0 U/L  Prolactin  Result Value Ref Range   Prolactin 10.4 ng/mL  CBC with  Differential/Platelet  Result Value Ref Range   WBC 5.0 4.0 - 10.5 K/uL   RBC 4.39 3.87 - 5.11 Mil/uL   Hemoglobin 12.4 12.0 - 15.0 g/dL   HCT 36.8 36.0 - 46.0 %   MCV 83.9 78.0 - 100.0 fl   MCHC 33.7 30.0 - 36.0 g/dL   RDW 12.7 11.5 - 15.5 %   Platelets 305.0 150.0 - 400.0 K/uL   Neutrophils Relative % 51.6 43.0 - 77.0 %   Lymphocytes Relative 29.9 12.0 - 46.0 %   Monocytes Relative 15.9 (H) 3.0 - 12.0 %   Eosinophils Relative 1.9 0.0 - 5.0 %   Basophils Relative 0.7 0.0 - 3.0 %   Neutro Abs 2.6 1.4 - 7.7 K/uL   Lymphs Abs 1.5 0.7 - 4.0 K/uL   Monocytes Absolute 0.8 0.1 - 1.0 K/uL   Eosinophils Absolute 0.1 0.0 - 0.7 K/uL   Basophils Absolute 0.0 0.0 - 0.1 K/uL    This visit occurred during the SARS-CoV-2 public health emergency.  Safety protocols were in place, including screening questions prior to the visit, additional usage of staff PPE, and extensive cleaning of exam room while observing appropriate contact time as indicated for disinfecting solutions.   COVID 19 screen:  No recent travel or known exposure to COVID19 The patient denies respiratory symptoms of COVID 19 at this time. The importance of social distancing was discussed today.   Assessment and Plan Problem List Items Addressed This Visit     Acute chest wall pain - Primary    Noncardiac in description and exam. No indication for EKG.   Treat with NSAIDs, heat and start home PT.       Meds ordered this encounter  Medications   EPINEPHrine 0.3 mg/0.3 mL IJ SOAJ injection    Sig: Inject 0.3 mg into the muscle as needed for anaphylaxis.    Dispense:  2 each    Refill:  0   diclofenac (VOLTAREN) 75 MG EC tablet    Sig: Take 1 tablet (75 mg total) by mouth 2 (two) times daily.    Dispense:  30 tablet    Refill:  0       Eliezer Lofts, MD

## 2021-12-05 NOTE — Patient Instructions (Addendum)
Can use diclofenac twice daily for chest wall pain x 2  weeks. Take Nexium daily while on.  Start chest wall stretching and heat on left chest wall.

## 2021-12-08 ENCOUNTER — Encounter: Payer: Self-pay | Admitting: Family Medicine

## 2021-12-09 ENCOUNTER — Other Ambulatory Visit: Payer: Self-pay | Admitting: Family Medicine

## 2021-12-09 MED ORDER — MELOXICAM 7.5 MG PO TABS: 7.5000 mg | ORAL_TABLET | Freq: Every day | ORAL | 0 refills | Status: DC

## 2021-12-30 LAB — HM MAMMOGRAPHY

## 2021-12-31 ENCOUNTER — Encounter: Payer: Self-pay | Admitting: Family Medicine

## 2022-07-21 ENCOUNTER — Ambulatory Visit: Payer: Managed Care, Other (non HMO)

## 2022-07-30 ENCOUNTER — Ambulatory Visit: Payer: Managed Care, Other (non HMO)

## 2022-08-04 ENCOUNTER — Ambulatory Visit (INDEPENDENT_AMBULATORY_CARE_PROVIDER_SITE_OTHER): Payer: Managed Care, Other (non HMO)

## 2022-08-04 DIAGNOSIS — Z23 Encounter for immunization: Secondary | ICD-10-CM

## 2022-09-22 ENCOUNTER — Encounter: Payer: Managed Care, Other (non HMO) | Admitting: Family Medicine

## 2022-10-22 ENCOUNTER — Encounter: Payer: Self-pay | Admitting: Family Medicine

## 2022-10-22 ENCOUNTER — Ambulatory Visit (INDEPENDENT_AMBULATORY_CARE_PROVIDER_SITE_OTHER): Payer: Managed Care, Other (non HMO) | Admitting: Family Medicine

## 2022-10-22 VITALS — BP 108/60 | HR 77 | Temp 97.8°F | Resp 16 | Ht 61.75 in | Wt 123.4 lb

## 2022-10-22 DIAGNOSIS — E538 Deficiency of other specified B group vitamins: Secondary | ICD-10-CM | POA: Diagnosis not present

## 2022-10-22 DIAGNOSIS — K219 Gastro-esophageal reflux disease without esophagitis: Secondary | ICD-10-CM | POA: Diagnosis not present

## 2022-10-22 DIAGNOSIS — Z23 Encounter for immunization: Secondary | ICD-10-CM

## 2022-10-22 DIAGNOSIS — G8929 Other chronic pain: Secondary | ICD-10-CM

## 2022-10-22 DIAGNOSIS — R5383 Other fatigue: Secondary | ICD-10-CM | POA: Diagnosis not present

## 2022-10-22 DIAGNOSIS — Z91018 Allergy to other foods: Secondary | ICD-10-CM

## 2022-10-22 DIAGNOSIS — Z0000 Encounter for general adult medical examination without abnormal findings: Secondary | ICD-10-CM | POA: Diagnosis not present

## 2022-10-22 DIAGNOSIS — M545 Low back pain, unspecified: Secondary | ICD-10-CM

## 2022-10-22 DIAGNOSIS — Z8639 Personal history of other endocrine, nutritional and metabolic disease: Secondary | ICD-10-CM

## 2022-10-22 DIAGNOSIS — Z13220 Encounter for screening for lipoid disorders: Secondary | ICD-10-CM

## 2022-10-22 LAB — TSH: TSH: 0.53 u[IU]/mL (ref 0.35–5.50)

## 2022-10-22 LAB — CBC WITH DIFFERENTIAL/PLATELET
Basophils Absolute: 0.1 10*3/uL (ref 0.0–0.1)
Basophils Relative: 1.1 % (ref 0.0–3.0)
Eosinophils Absolute: 0.1 10*3/uL (ref 0.0–0.7)
Eosinophils Relative: 1.1 % (ref 0.0–5.0)
HCT: 37.4 % (ref 36.0–46.0)
Hemoglobin: 12.4 g/dL (ref 12.0–15.0)
Lymphocytes Relative: 32.9 % (ref 12.0–46.0)
Lymphs Abs: 1.7 10*3/uL (ref 0.7–4.0)
MCHC: 33.1 g/dL (ref 30.0–36.0)
MCV: 87.1 fl (ref 78.0–100.0)
Monocytes Absolute: 0.5 10*3/uL (ref 0.1–1.0)
Monocytes Relative: 10 % (ref 3.0–12.0)
Neutro Abs: 2.8 10*3/uL (ref 1.4–7.7)
Neutrophils Relative %: 54.9 % (ref 43.0–77.0)
Platelets: 400 10*3/uL (ref 150.0–400.0)
RBC: 4.29 Mil/uL (ref 3.87–5.11)
RDW: 13 % (ref 11.5–15.5)
WBC: 5 10*3/uL (ref 4.0–10.5)

## 2022-10-22 LAB — COMPREHENSIVE METABOLIC PANEL
ALT: 16 U/L (ref 0–35)
AST: 17 U/L (ref 0–37)
Albumin: 4.7 g/dL (ref 3.5–5.2)
Alkaline Phosphatase: 63 U/L (ref 39–117)
BUN: 15 mg/dL (ref 6–23)
CO2: 30 mEq/L (ref 19–32)
Calcium: 9.5 mg/dL (ref 8.4–10.5)
Chloride: 105 mEq/L (ref 96–112)
Creatinine, Ser: 0.63 mg/dL (ref 0.40–1.20)
GFR: 98.5 mL/min (ref >60.00)
Glucose, Bld: 99 mg/dL (ref 70–99)
Potassium: 5 mEq/L (ref 3.5–5.1)
Sodium: 143 mEq/L (ref 135–145)
Total Bilirubin: 0.5 mg/dL (ref 0.2–1.2)
Total Protein: 6.9 g/dL (ref 6.0–8.3)

## 2022-10-22 LAB — VITAMIN D 25 HYDROXY (VIT D DEFICIENCY, FRACTURES): VITD: 28.95 ng/mL — ABNORMAL LOW (ref 30.00–100.00)

## 2022-10-22 LAB — LIPID PANEL
Cholesterol: 249 mg/dL — ABNORMAL HIGH (ref 0–200)
HDL: 91.3 mg/dL (ref >39.00)
LDL Cholesterol: 150 mg/dL — ABNORMAL HIGH (ref 0–99)
NonHDL: 157.71
Total CHOL/HDL Ratio: 3
Triglycerides: 40 mg/dL (ref 0.0–149.0)
VLDL: 8 mg/dL (ref 0.0–40.0)

## 2022-10-22 LAB — VITAMIN B12: Vitamin B-12: 637 pg/mL (ref 211–911)

## 2022-10-22 MED ORDER — EPINEPHRINE 0.3 MG/0.3ML IJ SOAJ: 0.3000 mg | INTRAMUSCULAR | 0 refills | Status: DC | PRN

## 2022-10-22 MED ORDER — MELOXICAM 7.5 MG PO TABS: 7.5000 mg | ORAL_TABLET | Freq: Every day | ORAL | 0 refills | Status: DC

## 2022-10-22 NOTE — Assessment & Plan Note (Signed)
Needs refill of epi pen.

## 2022-10-22 NOTE — Progress Notes (Signed)
Patient ID: [name], female    DOB: Nov 02, 1965, 57 y.o.   MRN: [id number]  This visit was conducted in person.  BP 108/60   Pulse 77   Temp 97.8 F (36.6 C)   Resp 16   Ht 5' 1.75" (1.568 m)   Wt 123 lb 6 oz (56 kg)   SpO2 98%   BMI 22.75 kg/m    CC: Chief Complaint  Patient presents with   Annual Exam    Subjective:   HPI: [name] is a 57 y.o. female presenting on 10/22/2022 for Annual Exam  Doing well overall.  Due for repeat labs. Mild fatigue.. requests labs to evaluate.  On B12 supplement for past deficiency... wonders if she still needs to take it.  Diet: Good Exercise: walking  2-3 miles daily 3-4 times a week.  Body mass index is 22.75 kg/m.  GERD: using ppi prn   Migraine: rare     Relevant past medical, surgical, family and social history reviewed and updated as indicated. Interim medical history since our last visit reviewed. Allergies and medications reviewed and updated. Outpatient Medications Prior to Visit  Medication Sig Dispense Refill   baclofen (LIORESAL) 10 MG tablet Take 5 mg by mouth every other day.     Cholecalciferol (VITAMIN D) 50 MCG (2000 UT) tablet Take 2,000 Units by mouth daily.     Cyanocobalamin (VITAMIN B 12 PO) Take 1,000 mcg by mouth daily.     EPINEPHrine 0.3 mg/0.3 mL IJ SOAJ injection Inject 0.3 mg into the muscle as needed for anaphylaxis. 2 each 0   meloxicam (MOBIC) 7.5 MG tablet Take 1-2 tablets (7.5-15 mg total) by mouth daily. 30 tablet 0   esomeprazole (NEXIUM) 20 MG capsule Take 20 mg by mouth every other day. (Patient not taking: Reported on 10/22/2022)     No facility-administered medications prior to visit.     Per HPI unless specifically indicated in ROS section below Review of Systems  Constitutional:  Positive for fatigue. Negative for fever.  HENT:  Negative for congestion.   Eyes:  Negative for pain.  Respiratory:  Negative for cough and shortness of breath.   Cardiovascular:   Negative for chest pain, palpitations and leg swelling.  Gastrointestinal:  Negative for abdominal pain.  Genitourinary:  Negative for dysuria and vaginal bleeding.  Musculoskeletal:  Negative for back pain.  Neurological:  Negative for syncope, light-headedness and headaches.  Psychiatric/Behavioral:  Negative for dysphoric mood.    Objective:  BP 108/60   Pulse 77   Temp 97.8 F (36.6 C)   Resp 16   Ht 5' 1.75" (1.568 m)   Wt 123 lb 6 oz (56 kg)   SpO2 98%   BMI 22.75 kg/m   Wt Readings from Last 3 Encounters:  10/22/22 123 lb 6 oz (56 kg)  12/05/21 124 lb 6 oz (56.4 kg)  09/18/21 123 lb 8 oz (56 kg)      Physical Exam Vitals and nursing note reviewed.  Constitutional:      General: She is not in acute distress.    Appearance: Normal appearance. She is well-developed. She is not ill-appearing or toxic-appearing.  HENT:     Head: Normocephalic.     Right Ear: Hearing, tympanic membrane, ear canal and external ear normal.     Left Ear: Hearing, tympanic membrane, ear canal and external ear normal.     Nose: Nose normal.  Eyes:     General: Lids are  normal. Lids are everted, no foreign bodies appreciated.     Conjunctiva/sclera: Conjunctivae normal.     Pupils: Pupils are equal, round, and reactive to light.  Neck:     Thyroid: No thyroid mass or thyromegaly.     Vascular: No carotid bruit.     Trachea: Trachea normal.  Cardiovascular:     Rate and Rhythm: Normal rate and regular rhythm.     Heart sounds: Normal heart sounds, S1 normal and S2 normal. No murmur heard.    No gallop.  Pulmonary:     Effort: Pulmonary effort is normal. No respiratory distress.     Breath sounds: Normal breath sounds. No wheezing, rhonchi or rales.  Abdominal:     General: Bowel sounds are normal. There is no distension or abdominal bruit.     Palpations: Abdomen is soft. There is no fluid wave or mass.     Tenderness: There is no abdominal tenderness. There is no guarding or rebound.      Hernia: No hernia is present.  Musculoskeletal:     Cervical back: Normal range of motion and neck supple.  Lymphadenopathy:     Cervical: No cervical adenopathy.  Skin:    General: Skin is warm and dry.     Findings: No rash.  Neurological:     Mental Status: She is alert.     Cranial Nerves: No cranial nerve deficit.     Sensory: No sensory deficit.  Psychiatric:        Mood and Affect: Mood is not anxious or depressed.        Speech: Speech normal.        Behavior: Behavior normal. Behavior is cooperative.        Judgment: Judgment normal.      Results for orders placed or performed in visit on 12/31/21  HM MAMMOGRAPHY  Result Value Ref Range   HM Mammogram 0-4 Bi-Rad 0-4 Bi-Rad, Self Reported Normal    This visit occurred during the SARS-CoV-2 public health emergency.  Safety protocols were in place, including screening questions prior to the visit, additional usage of staff PPE, and extensive cleaning of exam room while observing appropriate contact time as indicated for disinfecting solutions.   COVID 19 screen:  No recent travel or known exposure to COVID19 The patient denies respiratory symptoms of COVID 19 at this time. The importance of social distancing was discussed today.   Assessment and Plan   The patient's preventative maintenance and recommended screening tests for an annual wellness exam were reviewed in full today. Brought up to date unless services declined.  Counselled on the importance of diet, exercise, and its role in overall health and mortality. The patient's FH and SH was reviewed, including their home life, tobacco status, and drug and alcohol status.   Vaccines: flu given 07/2022, Shingles vaccine uptodate. COVID uptodate x 3   Given td today. AUQ:[facility] , nml pap, neg HPV 2019.. per pt done in2022 will get records. COLON: nml 06/2015  repeat in 10 years. Mammo: nml 12/2021  Refused HIV testing  Nonsmoker  ETOH/ Drug none/none  Hep C:  done DEXA: improving osteopenia 04/2020.. recheck in 5 years  Routine general medical examination at a health care facility  Screening for hypercholesterolemia -     Lipid panel -     Comprehensive metabolic panel  History of hyperprolactinemia -     Prolactin  Gastroesophageal reflux disease without esophagitis Assessment & Plan: Chronic, intermittent Only requiring nexium  prn.   Chronic bilateral low back pain without sciatica Assessment & Plan:  Chronic, intermittent.  Recommending tylenol for pain.  Can use low dose meloxicam with food if absolutely needed. Discussed GI SE possible with NSAIDs.   Walnut allergy Assessment & Plan:  Needs refill of epi pen.   B12 deficiency -     Vitamin B12  Other fatigue -     CBC with Differential/Platelet -     TSH -     VITAMIN D 25 Hydroxy (Vit-D Deficiency, Fractures)  Other orders -     Td vaccine greater than or equal to 7yo preservative free IM -     Meloxicam; Take 1-2 tablets (7.5-15 mg total) by mouth daily.  Dispense: 15 tablet; Refill: 0 -     EPINEPHrine; Inject 0.3 mg into the muscle as needed for anaphylaxis.  Dispense: 2 each; Refill: 0    Kerby Nora, MD

## 2022-10-22 NOTE — Assessment & Plan Note (Signed)
Chronic, intermittent.  Recommending tylenol for pain.  Can use low dose meloxicam with food if absolutely needed. Discussed GI SE possible with NSAIDs.

## 2022-10-22 NOTE — Assessment & Plan Note (Signed)
Chronic, intermittent Only requiring nexium prn.

## 2022-10-23 LAB — PROLACTIN: Prolactin: 9.7 ng/mL

## 2022-10-28 ENCOUNTER — Encounter: Payer: Self-pay | Admitting: Family Medicine

## 2023-01-01 LAB — HM MAMMOGRAPHY

## 2023-03-17 LAB — HM PAP SMEAR: HPV, high-risk: NEGATIVE

## 2023-06-22 ENCOUNTER — Ambulatory Visit: Payer: Managed Care, Other (non HMO)

## 2023-07-22 ENCOUNTER — Ambulatory Visit: Payer: Managed Care, Other (non HMO)

## 2023-08-11 ENCOUNTER — Ambulatory Visit (INDEPENDENT_AMBULATORY_CARE_PROVIDER_SITE_OTHER): Payer: Managed Care, Other (non HMO)

## 2023-08-11 DIAGNOSIS — Z23 Encounter for immunization: Secondary | ICD-10-CM | POA: Diagnosis not present

## 2024-01-07 LAB — HM MAMMOGRAPHY

## 2024-01-10 ENCOUNTER — Encounter: Payer: Self-pay | Admitting: Family Medicine

## 2024-01-19 ENCOUNTER — Encounter: Payer: Managed Care, Other (non HMO) | Admitting: Family Medicine

## 2024-01-20 ENCOUNTER — Encounter: Payer: Self-pay | Admitting: Family Medicine

## 2024-01-20 ENCOUNTER — Other Ambulatory Visit: Payer: Self-pay | Admitting: Family Medicine

## 2024-01-20 ENCOUNTER — Ambulatory Visit (INDEPENDENT_AMBULATORY_CARE_PROVIDER_SITE_OTHER): Payer: Managed Care, Other (non HMO) | Admitting: Family Medicine

## 2024-01-20 VITALS — BP 90/60 | HR 86 | Temp 98.2°F | Ht 61.75 in | Wt 120.5 lb

## 2024-01-20 DIAGNOSIS — Z13220 Encounter for screening for lipoid disorders: Secondary | ICD-10-CM | POA: Diagnosis not present

## 2024-01-20 DIAGNOSIS — M85851 Other specified disorders of bone density and structure, right thigh: Secondary | ICD-10-CM | POA: Diagnosis not present

## 2024-01-20 DIAGNOSIS — Z0000 Encounter for general adult medical examination without abnormal findings: Secondary | ICD-10-CM | POA: Diagnosis not present

## 2024-01-20 DIAGNOSIS — Z862 Personal history of diseases of the blood and blood-forming organs and certain disorders involving the immune mechanism: Secondary | ICD-10-CM | POA: Diagnosis not present

## 2024-01-20 DIAGNOSIS — E7800 Pure hypercholesterolemia, unspecified: Secondary | ICD-10-CM | POA: Diagnosis not present

## 2024-01-20 DIAGNOSIS — E538 Deficiency of other specified B group vitamins: Secondary | ICD-10-CM

## 2024-01-20 DIAGNOSIS — Z8639 Personal history of other endocrine, nutritional and metabolic disease: Secondary | ICD-10-CM | POA: Diagnosis not present

## 2024-01-20 DIAGNOSIS — Z818 Family history of other mental and behavioral disorders: Secondary | ICD-10-CM | POA: Insufficient documentation

## 2024-01-20 LAB — CBC WITH DIFFERENTIAL/PLATELET
Basophils Absolute: 0.1 10*3/uL (ref 0.0–0.1)
Basophils Relative: 1.5 % (ref 0.0–3.0)
Eosinophils Absolute: 0 10*3/uL (ref 0.0–0.7)
Eosinophils Relative: 1 % (ref 0.0–5.0)
HCT: 37.6 % (ref 36.0–46.0)
Hemoglobin: 12.4 g/dL (ref 12.0–15.0)
Lymphocytes Relative: 35.3 % (ref 12.0–46.0)
Lymphs Abs: 1.7 10*3/uL (ref 0.7–4.0)
MCHC: 33 g/dL (ref 30.0–36.0)
MCV: 87.4 fl (ref 78.0–100.0)
Monocytes Absolute: 0.5 10*3/uL (ref 0.1–1.0)
Monocytes Relative: 10 % (ref 3.0–12.0)
Neutro Abs: 2.5 10*3/uL (ref 1.4–7.7)
Neutrophils Relative %: 52.2 % (ref 43.0–77.0)
Platelets: 339 10*3/uL (ref 150.0–400.0)
RBC: 4.3 Mil/uL (ref 3.87–5.11)
RDW: 12.5 % (ref 11.5–15.5)
WBC: 4.9 10*3/uL (ref 4.0–10.5)

## 2024-01-20 LAB — COMPREHENSIVE METABOLIC PANEL WITH GFR
ALT: 12 U/L (ref 0–35)
AST: 17 U/L (ref 0–37)
Albumin: 4.9 g/dL (ref 3.5–5.2)
Alkaline Phosphatase: 54 U/L (ref 39–117)
BUN: 7 mg/dL (ref 6–23)
CO2: 28 meq/L (ref 19–32)
Calcium: 9.4 mg/dL (ref 8.4–10.5)
Chloride: 103 meq/L (ref 96–112)
Creatinine, Ser: 0.61 mg/dL (ref 0.40–1.20)
GFR: 98.4 mL/min (ref >60.00)
Glucose, Bld: 95 mg/dL (ref 70–99)
Potassium: 4.2 meq/L (ref 3.5–5.1)
Sodium: 140 meq/L (ref 135–145)
Total Bilirubin: 0.5 mg/dL (ref 0.2–1.2)
Total Protein: 7.1 g/dL (ref 6.0–8.3)

## 2024-01-20 LAB — LIPID PANEL
Cholesterol: 203 mg/dL — ABNORMAL HIGH (ref 0–200)
HDL: 79.4 mg/dL (ref >39.00)
LDL Cholesterol: 108 mg/dL — ABNORMAL HIGH (ref 0–99)
NonHDL: 123.68
Total CHOL/HDL Ratio: 3
Triglycerides: 78 mg/dL (ref 0.0–149.0)
VLDL: 15.6 mg/dL (ref 0.0–40.0)

## 2024-01-20 LAB — VITAMIN D 25 HYDROXY (VIT D DEFICIENCY, FRACTURES): VITD: 36.02 ng/mL (ref 30.00–100.00)

## 2024-01-20 LAB — VITAMIN B12: Vitamin B-12: 1173 pg/mL — ABNORMAL HIGH (ref 211–911)

## 2024-01-20 LAB — PROLACTIN: Prolactin: 13.7 ng/mL

## 2024-01-20 MED ORDER — EPINEPHRINE 0.3 MG/0.3ML IJ SOAJ: 0.3000 mg | INTRAMUSCULAR | 0 refills | Status: DC | PRN

## 2024-01-20 NOTE — Progress Notes (Signed)
 Patient ID: [name], female    DOB: 1965/12/13, 58 y.o.   MRN: [id number]  This visit was conducted in person.  BP 90/60 (BP Location: Left Arm, Patient Position: Sitting, Cuff Size: Normal)   Pulse 86   Temp 98.2 F (36.8 C) (Temporal)   Ht 5' 1.75" (1.568 m)   Wt 120 lb 8 oz (54.7 kg)   SpO2 96%   BMI 22.22 kg/m    CC: No chief complaint on file.   Subjective:   HPI: [name] is a 57 y.o. female presenting on 01/20/2024 for No chief complaint on file.  Doing well overall.  Due for repeat labs.  B12 def: on supplement.  Diet: Good Exercise: walking  2-3 miles daily 3-4 times a week.  Body mass index is 22.22 kg/m.  GERD: using ppi prn   Migraine: rare    Flowsheet Row Office Visit from 01/20/2024 in [facility] Total Score 0       BP Readings from Last 3 Encounters:  01/20/24 90/60  10/22/22 108/60  12/05/21 110/60      Relevant past medical, surgical, family and social history reviewed and updated as indicated. Interim medical history since our last visit reviewed. Allergies and medications reviewed and updated. Outpatient Medications Prior to Visit  Medication Sig Dispense Refill   baclofen (LIORESAL) 10 MG tablet Take 5 mg by mouth every other day.     Cholecalciferol (VITAMIN D) 50 MCG (2000 UT) tablet Take 2,000 Units by mouth daily.     Cyanocobalamin (VITAMIN B 12 PO) Take 1,000 mcg by mouth daily.     esomeprazole (NEXIUM) 20 MG capsule Take 20 mg by mouth daily as needed.     meloxicam (MOBIC) 7.5 MG tablet Take 1-2 tablets (7.5-15 mg total) by mouth daily. 15 tablet 0   EPINEPHrine 0.3 mg/0.3 mL IJ SOAJ injection Inject 0.3 mg into the muscle as needed for anaphylaxis. 2 each 0   esomeprazole (NEXIUM) 20 MG capsule Take 20 mg by mouth every other day.     No facility-administered medications prior to visit.     Per HPI unless specifically indicated in ROS section  below Review of Systems  Constitutional:  Positive for fatigue. Negative for fever.  HENT:  Negative for congestion.   Eyes:  Negative for pain.  Respiratory:  Negative for cough and shortness of breath.   Cardiovascular:  Negative for chest pain, palpitations and leg swelling.  Gastrointestinal:  Negative for abdominal pain.  Genitourinary:  Negative for dysuria and vaginal bleeding.  Musculoskeletal:  Negative for back pain.  Neurological:  Negative for syncope, light-headedness and headaches.  Psychiatric/Behavioral:  Negative for dysphoric mood.    Objective:  BP 90/60 (BP Location: Left Arm, Patient Position: Sitting, Cuff Size: Normal)   Pulse 86   Temp 98.2 F (36.8 C) (Temporal)   Ht 5' 1.75" (1.568 m)   Wt 120 lb 8 oz (54.7 kg)   SpO2 96%   BMI 22.22 kg/m   Wt Readings from Last 3 Encounters:  01/20/24 120 lb 8 oz (54.7 kg)  10/22/22 123 lb 6 oz (56 kg)  12/05/21 124 lb 6 oz (56.4 kg)      Physical Exam Vitals and nursing note reviewed.  Constitutional:      General: She is not in acute distress.    Appearance: Normal appearance. She is well-developed. She is not ill-appearing or toxic-appearing.  HENT:  Head: Normocephalic.     Right Ear: Hearing, tympanic membrane, ear canal and external ear normal.     Left Ear: Hearing, tympanic membrane, ear canal and external ear normal.     Nose: Nose normal.  Eyes:     General: Lids are normal. Lids are everted, no foreign bodies appreciated.     Conjunctiva/sclera: Conjunctivae normal.     Pupils: Pupils are equal, round, and reactive to light.  Neck:     Thyroid: No thyroid mass or thyromegaly.     Vascular: No carotid bruit.     Trachea: Trachea normal.  Cardiovascular:     Rate and Rhythm: Normal rate and regular rhythm.     Heart sounds: Normal heart sounds, S1 normal and S2 normal. No murmur heard.    No gallop.  Pulmonary:     Effort: Pulmonary effort is normal. No respiratory distress.     Breath  sounds: Normal breath sounds. No wheezing, rhonchi or rales.  Abdominal:     General: Bowel sounds are normal. There is no distension or abdominal bruit.     Palpations: Abdomen is soft. There is no fluid wave or mass.     Tenderness: There is no abdominal tenderness. There is no guarding or rebound.     Hernia: No hernia is present.  Musculoskeletal:     Cervical back: Normal range of motion and neck supple.  Lymphadenopathy:     Cervical: No cervical adenopathy.  Skin:    General: Skin is warm and dry.     Findings: No rash.  Neurological:     Mental Status: She is alert.     Cranial Nerves: No cranial nerve deficit.     Sensory: No sensory deficit.  Psychiatric:        Mood and Affect: Mood is not anxious or depressed.        Speech: Speech normal.        Behavior: Behavior normal. Behavior is cooperative.        Judgment: Judgment normal.       Results for orders placed or performed in visit on 01/10/24  HM MAMMOGRAPHY   Collection Time: 01/07/24  8:36 AM  Result Value Ref Range   HM Mammogram 0-4 Bi-Rad 0-4 Bi-Rad, Self Reported Normal    This visit occurred during the SARS-CoV-2 public health emergency.  Safety protocols were in place, including screening questions prior to the visit, additional usage of staff PPE, and extensive cleaning of exam room while observing appropriate contact time as indicated for disinfecting solutions.   COVID 19 screen:  No recent travel or known exposure to COVID19 The patient denies respiratory symptoms of COVID 19 at this time. The importance of social distancing was discussed today.   Assessment and Plan   The patient's preventative maintenance and recommended screening tests for an annual wellness exam were reviewed in full today. Brought up to date unless services declined.  Counselled on the importance of diet, exercise, and its role in overall health and mortality. The patient's FH and SH was reviewed, including their home life,  tobacco status, and drug and alcohol status.   Vaccines: flu given 07/2022, Shingles vaccine uptodate. COVID uptodate x 3 , Tdap ZOX:[facility] , nml pap, neg HPV 2019.. per pt done in2024 will get records. COLON: nml 06/2015  repeat in 10 years. Mammo: nml 12/2023  Refused HIV testing  Nonsmoker  ETOH/ Drug none/none  Hep C: done DEXA: improving osteopenia 04/2020.. recheck in 5  years  Routine general medical examination at a health care facility  Screening for hypercholesterolemia -     Comprehensive metabolic panel with GFR  Vitamin B 12 deficiency -     Vitamin B12  History of hyperprolactinemia -     Prolactin  Osteopenia of right hip -     VITAMIN D 25 Hydroxy (Vit-D Deficiency, Fractures) -     DG Bone Density; Future  History of anemia -     CBC with Differential/Platelet  Family history of  vascular dementia  Hypercholesteremia Assessment & Plan:  CHronic   LDL above gola last year, but very high HDL. MInimal other risk factors... father with MI in 29s... also had vascular dementia.   Will plan Calcium Cardiac CT score to further assess risk and need for statin.  Orders: -     Lipid panel     Kerby Nora, MD

## 2024-01-20 NOTE — Assessment & Plan Note (Signed)
 CHronic   LDL above gola last year, but very high HDL. MInimal other risk factors... father with MI in 66s... also had vascular dementia.   Will plan Calcium Cardiac CT score to further assess risk and need for statin.

## 2024-01-20 NOTE — Telephone Encounter (Signed)
 Pharmacy comment: Alternative Requested:THE PRESCRIBED MEDICATION IS NOT COVERED BY INSURANCE. PLEASE CONSIDER CHANGING TO ONE OF THE SUGGESTED COVERED ALTERNATIVES.

## 2024-01-21 ENCOUNTER — Other Ambulatory Visit: Payer: Self-pay | Admitting: Family Medicine

## 2024-01-21 DIAGNOSIS — E7800 Pure hypercholesterolemia, unspecified: Secondary | ICD-10-CM

## 2024-01-21 DIAGNOSIS — Z8249 Family history of ischemic heart disease and other diseases of the circulatory system: Secondary | ICD-10-CM

## 2024-01-26 ENCOUNTER — Encounter: Payer: Self-pay | Admitting: *Deleted

## 2024-02-04 ENCOUNTER — Other Ambulatory Visit: Payer: Self-pay | Admitting: Family Medicine

## 2024-03-16 ENCOUNTER — Ambulatory Visit (HOSPITAL_BASED_OUTPATIENT_CLINIC_OR_DEPARTMENT_OTHER)
Admission: RE | Admit: 2024-03-16 | Discharge: 2024-03-16 | Disposition: A | Discharge status: Home / Self Care | Payer: Self-pay | Source: Ambulatory Visit | Attending: Family Medicine | Admitting: Family Medicine

## 2024-03-16 DIAGNOSIS — E7800 Pure hypercholesterolemia, unspecified: Secondary | ICD-10-CM | POA: Insufficient documentation

## 2024-03-16 DIAGNOSIS — Z8249 Family history of ischemic heart disease and other diseases of the circulatory system: Secondary | ICD-10-CM | POA: Insufficient documentation

## 2024-03-17 ENCOUNTER — Ambulatory Visit: Payer: Self-pay | Admitting: Family Medicine

## 2024-03-22 ENCOUNTER — Other Ambulatory Visit: Payer: Self-pay | Admitting: Family Medicine

## 2024-03-22 DIAGNOSIS — E7800 Pure hypercholesterolemia, unspecified: Secondary | ICD-10-CM

## 2024-03-22 DIAGNOSIS — R931 Abnormal findings on diagnostic imaging of heart and coronary circulation: Secondary | ICD-10-CM

## 2024-05-09 ENCOUNTER — Ambulatory Visit: Attending: Internal Medicine | Admitting: Internal Medicine

## 2024-05-09 ENCOUNTER — Other Ambulatory Visit (HOSPITAL_COMMUNITY): Payer: Self-pay

## 2024-05-09 ENCOUNTER — Encounter: Payer: Self-pay | Admitting: Internal Medicine

## 2024-05-09 VITALS — BP 100/60 | HR 76 | Ht 61.75 in | Wt 120.6 lb

## 2024-05-09 DIAGNOSIS — E7800 Pure hypercholesterolemia, unspecified: Secondary | ICD-10-CM

## 2024-05-09 DIAGNOSIS — Z79899 Other long term (current) drug therapy: Secondary | ICD-10-CM | POA: Diagnosis not present

## 2024-05-09 DIAGNOSIS — I872 Venous insufficiency (chronic) (peripheral): Secondary | ICD-10-CM | POA: Diagnosis not present

## 2024-05-09 DIAGNOSIS — Z7189 Other specified counseling: Secondary | ICD-10-CM

## 2024-05-09 DIAGNOSIS — R002 Palpitations: Secondary | ICD-10-CM | POA: Diagnosis not present

## 2024-05-09 DIAGNOSIS — I2510 Atherosclerotic heart disease of native coronary artery without angina pectoris: Secondary | ICD-10-CM

## 2024-05-09 MED ORDER — ROSUVASTATIN CALCIUM 5 MG PO TABS
5.0000 mg | ORAL_TABLET | Freq: Every day | ORAL | 3 refills | Status: AC
  Filled 2024-05-09: qty 31, 31d supply, fill #0
  Filled 2024-05-09: qty 59, 59d supply, fill #0
  Filled 2024-08-06: qty 90, 90d supply, fill #1
  Filled 2024-10-31: qty 90, 90d supply, fill #2

## 2024-05-09 NOTE — Progress Notes (Signed)
 Cardiology Office Note:  .   Date:  05/09/2024  ID:  [name], DOB 08/16/1966, MRN [id number] PCP: Avelina Greig BRAVO, MD  [facility] Providers Cardiologist:  Soyla DELENA Merck, MD    History of Present Illness: .   [name] is a 58 y.o. female.  Discussed the use of AI scribe software for clinical note transcription with the patient, who gave verbal consent to proceed.  History of Present Illness [name] is a 58 year old female who presents for evaluation of her calcium  score and cholesterol levels.  She has a calcium  score of 75, placing her in the 90th percentile for her age and sex matched controls. Her cholesterol levels, previously managed, have recently increased. She experiences heart palpitations about once a month, lasting a few minutes, without associated chest pain, pressure, tightness, shortness of breath, or dizziness. Her exercise routine includes a two to three hour video workout three times a week.  Family history is significant for cardiovascular issues. Her father had a 70% blockage in the left anterior descending artery and received a stent in another artery. He was diagnosed with vascular dementia and died at age 23. Her paternal grandmother died of a massive heart attack, and her maternal grandmother had arteriosclerosis.  She experiences leg swelling when standing for long periods, which improves with elevation. She previously consulted a vein clinic but did not complete the evaluation. She has a history of esophageal spasms, managed with baclofen , and previously felt these could be heart-related pain. She consumes one cup of coffee daily.    ROS: negative except per HPI above.  Studies Reviewed: SABRA   EKG Interpretation Date/Time:  Tuesday May 09 2024 10:14:08 EDT Ventricular Rate:  76 PR Interval:  152 QRS Duration:  78 QT Interval:  378 QTC Calculation: 425 R Axis:   81  Text Interpretation: Normal sinus rhythm Normal  ECG Confirmed by Merck Soyla ([id number]) on 05/09/2024 10:22:08 AM    Results RADIOLOGY Calcium  Score: 75, 90th percentile for age and ethnicity (2025) Chest CT: Calcification in left anterior descending artery, no calcification in left main coronary artery or right coronary artery (2025)  DIAGNOSTIC EKG: Normal (2025) Risk Assessment/Calculations:       Physical Exam:   VS:  BP 100/60 (BP Location: Right Arm, Patient Position: Sitting, Cuff Size: Normal)   Pulse 76   Ht 5' 1.75 (1.568 m)   Wt 120 lb 9.6 oz (54.7 kg)   SpO2 99%   BMI 22.24 kg/m    Wt Readings from Last 3 Encounters:  05/09/24 120 lb 9.6 oz (54.7 kg)  01/20/24 120 lb 8 oz (54.7 kg)  10/22/22 123 lb 6 oz (56 kg)     Physical Exam GENERAL: Alert, cooperative, well developed, no acute distress. HEENT: Normocephalic, normal oropharynx, moist mucous membranes. CHEST: Clear to auscultation bilaterally, no wheezes, rhonchi, or crackles. CARDIOVASCULAR: Normal heart rate and rhythm, S1 and S2 normal without murmurs. ABDOMEN: Soft, non-tender, non-distended, without organomegaly, normal bowel sounds. EXTREMITIES: No cyanosis or edema, no significant abnormalities in legs. NEUROLOGICAL: Cranial nerves grossly intact, moves all extremities without gross motor or sensory deficit.   ASSESSMENT AND PLAN: .    Assessment and Plan Assessment & Plan Coronary artery calcifications Medication management Cardiac risk counseling. Mild calcified plaque in the left anterior descending artery with a calcium  score of 75, 90th percentile for age and ethnicity. No significant symptoms indicating severe blockage. Goal to prevent further plaque development and stabilize  existing plaques. - Initiate rosuvastatin  5 mg daily. - Monitor for muscle aches as a side effect. - Consider CoQ10 or magnesium supplement daily if muscle aches occur. - Encourage regular exercise and a heart-healthy diet. - Educated on signs of heart attack and  advised to report new symptoms immediately. - Consider coronary CT scan with contrast if symptoms present  Hyperlipidemia in the setting of coronary artery disease Elevated LDL cholesterol at 108 mg/dL with a target of less than 70 mg/dL due to coronary artery disease. - Start rosuvastatin  5 mg daily. - Recheck lipid panel and liver function tests in 3 months. - Check lipoprotein A level in 3 months to assess genetic risk. - Consider alternative cholesterol-lowering medications if statins are not tolerated. - Encourage dietary modifications to lower cholesterol, including increased protein intake and avoidance of refined carbohydrates.  Palpitations Monthly episodes of heart racing lasting a few minutes, not associated with exercise or significant symptoms. Normal EKG. Possible triggers include caffeine and dehydration. - Monitor frequency and triggers of palpitations. - Advise on reducing caffeine intake and ensuring adequate hydration. - Report any increase in frequency or severity of palpitations.  Chronic lower extremity venous insufficiency with edema and varicose veins Intermittent leg swelling, especially after prolonged standing, likely due to venous insufficiency. Visible varicose veins. No significant symptoms suggestive of heart failure. - Recommend wearing medium compression socks to reduce swelling. - Advise elevating legs to decrease edema. - Consider referral to vein clinic for further evaluation if symptoms persist or worsen.      Soyla Merck, MD, FACC

## 2024-05-09 NOTE — Patient Instructions (Signed)
 Medication Instructions:  Start: Rosuvastatin  (Crestor ) 5 mg one tablet, once daily   Lab Work: In about 3 months (due around 08/09/2024), please have a FASTING Lipid Panel, Liver Panel (LFT's or hepatic function panel), and Lipoprotein A Drawn at any [facility] or back at our building on [address].  If you have labs (blood work) drawn today and your tests are completely normal, you will receive your results only by: MyChart Message (if you have MyChart) OR A paper copy in the mail If you have any lab test that is abnormal or we need to change your treatment, we will call you to review the results.   Follow-Up: At [facility], you and your health needs are our priority.  As part of our continuing mission to provide you with exceptional heart care, our providers are all part of one team.  This team includes your primary Cardiologist (physician) and Advanced Practice Providers or APPs (Physician Assistants and Nurse Practitioners) who all work together to provide you with the care you need, when you need it.  Your next appointment:   3 -88month(s)  Provider:   Gayatri A Acharya, MD or Callie Goodrich, PA, or Scot Ford, [place], or Damien Braver, NP

## 2024-08-01 ENCOUNTER — Other Ambulatory Visit (HOSPITAL_COMMUNITY): Payer: Self-pay

## 2024-08-01 LAB — LIPID PANEL

## 2024-08-01 MED ORDER — FLUZONE 0.5 ML IM SUSY
0.5000 mL | PREFILLED_SYRINGE | Freq: Once | INTRAMUSCULAR | 0 refills | Status: AC
  Filled 2024-08-01: qty 0.5, 1d supply, fill #0

## 2024-08-02 LAB — HEPATIC FUNCTION PANEL
ALT: 29 [IU]/L (ref 0–32)
AST: 22 [IU]/L (ref 0–40)
Albumin: 4.9 g/dL (ref 3.8–4.9)
Alkaline Phosphatase: 76 [IU]/L (ref 49–135)
Bilirubin Total: 0.3 mg/dL (ref 0.0–1.2)
Bilirubin, Direct: 0.14 mg/dL (ref 0.00–0.40)
Total Protein: 7 g/dL (ref 6.0–8.5)

## 2024-08-02 LAB — LIPOPROTEIN A (LPA): Lipoprotein (a): 22.8 nmol/L (ref <75.0)

## 2024-08-02 LAB — LIPID PANEL
Cholesterol, Total: 156 mg/dL (ref 100–199)
HDL: 88 mg/dL (ref >39)
LDL CALC COMMENT:: 1.8 ratio (ref 0.0–4.4)
LDL Chol Calc (NIH): 57 mg/dL (ref 0–99)
Triglycerides: 54 mg/dL (ref 0–149)
VLDL Cholesterol Cal: 11 mg/dL (ref 5–40)

## 2024-08-03 ENCOUNTER — Ambulatory Visit: Payer: Self-pay | Admitting: Internal Medicine

## 2024-08-04 ENCOUNTER — Encounter: Payer: Self-pay | Admitting: *Deleted

## 2024-08-08 ENCOUNTER — Ambulatory Visit: Attending: Internal Medicine | Admitting: Internal Medicine

## 2024-08-08 VITALS — BP 100/58 | HR 77 | Ht 62.0 in | Wt 121.6 lb

## 2024-08-08 DIAGNOSIS — E7800 Pure hypercholesterolemia, unspecified: Secondary | ICD-10-CM | POA: Diagnosis not present

## 2024-08-08 DIAGNOSIS — Z7189 Other specified counseling: Secondary | ICD-10-CM

## 2024-08-08 DIAGNOSIS — R002 Palpitations: Secondary | ICD-10-CM

## 2024-08-08 DIAGNOSIS — I872 Venous insufficiency (chronic) (peripheral): Secondary | ICD-10-CM

## 2024-08-08 DIAGNOSIS — I2510 Atherosclerotic heart disease of native coronary artery without angina pectoris: Secondary | ICD-10-CM | POA: Diagnosis not present

## 2024-08-08 NOTE — Progress Notes (Signed)
  Cardiology Office Note:  .   Date:  08/08/2024  ID:  Erminio Vannie Smock, DOB [birth year], MRN [id number] PCP: Avelina Greig BRAVO, MD  [facility] Providers Cardiologist:  Soyla DELENA Merck, MD    History of Present Illness: .   [name] is a 58 y.o. female.  Discussed the use of AI scribe software for clinical note transcription with the patient, who gave verbal consent to proceed.  History of Present Illness [name] is a 58 year old female with coronary artery calcifications who presents for follow-up on her cholesterol management.  She has coronary artery calcifications with a calcium  score of 75, in the 90th percentile. Since July 2025, she has been on rosuvastatin  5 mg daily, reducing her LDL cholesterol from 108 to 57 mg/dL. She tolerates the medication well without issues. She takes CoQ10 alongside rosuvastatin  without adverse effects. She is making dietary improvements, with minimal indulgence in foods like hamburgers, and is cooking healthier meals at home.  Recent lab results show normal liver function tests and a normal lipoprotein A level. Her albumin level normal, she inquires about this. She inquires about beet supplements for heart health and ensures adequate protein intake.    ROS: negative except per HPI above.  Studies Reviewed: .        Results LABS LFTs: normal LDL: 57 Lipoprotein A: normal Albumin: 4.9 (01/2024)  DIAGNOSTIC EKG: Normal (04/2024) Risk Assessment/Calculations:       Physical Exam:   VS:  BP (!) 100/58 (BP Location: Right Arm, Patient Position: Sitting, Cuff Size: Normal)   Pulse 77   Ht 5' 2 (1.575 m)   Wt 121 lb 9.6 oz (55.2 kg)   BMI 22.24 kg/m    Wt Readings from Last 3 Encounters:  08/08/24 121 lb 9.6 oz (55.2 kg)  05/09/24 120 lb 9.6 oz (54.7 kg)  01/20/24 120 lb 8 oz (54.7 kg)     Physical Exam GENERAL: Alert, cooperative, well developed, no acute distress HEENT: Normocephalic, normal  oropharynx, moist mucous membranes CHEST: Clear to auscultation bilaterally, No wheezes, rhonchi, or crackles CARDIOVASCULAR: Normal heart rate and rhythm, S1 and S2 normal without murmurs ABDOMEN: Soft, non-tender, non-distended, without organomegaly, Normal bowel sounds EXTREMITIES: No cyanosis or edema NEUROLOGICAL: Cranial nerves grossly intact, Moves all extremities without gross motor or sensory deficit   ASSESSMENT AND PLAN: .    Assessment and Plan Assessment & Plan Coronary artery calcification with hyperlipidemia Coronary artery calcification with calcium  score of 75, 90th percentile. LDL reduced from 108 to 57 with rosuvastatin , meeting goals. Lipoprotein A normal, indicating lower cardiovascular risk. - Continue rosuvastatin  5 mg daily. - Advise on dietary modifications for cholesterol management. - coronary CTA scan if symptoms develop.  HLD - continue crestor  5 mg daily  Palpitations -no significant recurrence.   Chronic lower extremity venous insufficiency with edema and varicose veins - no significant LE edema today  Recording duration: 19 minutes      Soyla Merck, MD, FACC

## 2024-08-08 NOTE — Patient Instructions (Signed)
 Medication Instructions:  No medication changes were made at this visit. Continue current regimen.   *If you need a refill on your cardiac medications before your next appointment, please call your pharmacy*  Lab Work: None ordered today. If you have labs (blood work) drawn today and your tests are completely normal, you will receive your results only by: MyChart Message (if you have MyChart) OR A paper copy in the mail If you have any lab test that is abnormal or we need to change your treatment, we will call you to review the results.  Testing/Procedures: None ordered today.  Follow-Up: At [facility], you and your health needs are our priority.  As part of our continuing mission to provide you with exceptional heart care, our providers are all part of one team.  This team includes your primary Cardiologist (physician) and Advanced Practice Providers or APPs (Physician Assistants and Nurse Practitioners) who all work together to provide you with the care you need, when you need it.  Your next appointment:   1 year(s)  Provider:   Gayatri A Acharya, MD

## 2024-11-01 ENCOUNTER — Other Ambulatory Visit (HOSPITAL_COMMUNITY): Payer: Self-pay

## 2025-02-01 ENCOUNTER — Encounter: Admitting: Family Medicine
